# Patient Record
Sex: Female | Born: 1975 | Race: White | Hispanic: No | Marital: Married | Smoking: Never smoker
Health system: Southern US, Community
[De-identification: ages and names within clinical notes are randomized; demographics above are authoritative.]

## PROBLEM LIST (undated history)

## (undated) DIAGNOSIS — I2510 Atherosclerotic heart disease of native coronary artery without angina pectoris: Secondary | ICD-10-CM

## (undated) DIAGNOSIS — IMO0002 Reserved for concepts with insufficient information to code with codable children: Secondary | ICD-10-CM

## (undated) DIAGNOSIS — R87619 Unspecified abnormal cytological findings in specimens from cervix uteri: Secondary | ICD-10-CM

## (undated) DIAGNOSIS — E785 Hyperlipidemia, unspecified: Secondary | ICD-10-CM

## (undated) DIAGNOSIS — N390 Urinary tract infection, site not specified: Secondary | ICD-10-CM

## (undated) DIAGNOSIS — E079 Disorder of thyroid, unspecified: Secondary | ICD-10-CM

## (undated) DIAGNOSIS — I10 Essential (primary) hypertension: Secondary | ICD-10-CM

## (undated) HISTORY — DX: Disorder of thyroid, unspecified: E07.9

## (undated) HISTORY — DX: Unspecified abnormal cytological findings in specimens from cervix uteri: R87.619

## (undated) HISTORY — PX: VASECTOMY: SHX75

## (undated) HISTORY — DX: Reserved for concepts with insufficient information to code with codable children: IMO0002

## (undated) HISTORY — DX: Hyperlipidemia, unspecified: E78.5

## (undated) HISTORY — DX: Urinary tract infection, site not specified: N39.0

## (undated) HISTORY — DX: Atherosclerotic heart disease of native coronary artery without angina pectoris: I25.10

## (undated) HISTORY — DX: Essential (primary) hypertension: I10

---

## 1998-02-27 ENCOUNTER — Ambulatory Visit (HOSPITAL_COMMUNITY): Admission: RE | Admit: 1998-02-27 | Discharge: 1998-02-27 | Payer: Self-pay | Admitting: Obstetrics and Gynecology

## 1999-09-12 ENCOUNTER — Other Ambulatory Visit: Admission: RE | Admit: 1999-09-12 | Discharge: 1999-09-12 | Payer: Self-pay | Admitting: *Deleted

## 2000-03-14 ENCOUNTER — Inpatient Hospital Stay (HOSPITAL_COMMUNITY): Admission: AD | Admit: 2000-03-14 | Discharge: 2000-03-14 | Payer: Self-pay | Admitting: Obstetrics and Gynecology

## 2000-04-16 ENCOUNTER — Inpatient Hospital Stay (HOSPITAL_COMMUNITY): Admission: AD | Admit: 2000-04-16 | Discharge: 2000-04-19 | Payer: Self-pay | Admitting: *Deleted

## 2000-05-31 ENCOUNTER — Other Ambulatory Visit: Admission: RE | Admit: 2000-05-31 | Discharge: 2000-05-31 | Payer: Self-pay | Admitting: *Deleted

## 2000-07-22 ENCOUNTER — Other Ambulatory Visit: Admission: RE | Admit: 2000-07-22 | Discharge: 2000-07-22 | Payer: Self-pay | Admitting: *Deleted

## 2000-11-30 ENCOUNTER — Other Ambulatory Visit: Admission: RE | Admit: 2000-11-30 | Discharge: 2000-11-30 | Payer: Self-pay | Admitting: *Deleted

## 2001-05-02 ENCOUNTER — Emergency Department (HOSPITAL_COMMUNITY): Admission: EM | Admit: 2001-05-02 | Discharge: 2001-05-02 | Payer: Self-pay | Admitting: Emergency Medicine

## 2001-06-14 ENCOUNTER — Other Ambulatory Visit: Admission: RE | Admit: 2001-06-14 | Discharge: 2001-06-14 | Payer: Self-pay | Admitting: *Deleted

## 2002-07-04 ENCOUNTER — Other Ambulatory Visit: Admission: RE | Admit: 2002-07-04 | Discharge: 2002-07-04 | Payer: Self-pay | Admitting: *Deleted

## 2002-10-15 ENCOUNTER — Encounter: Admission: RE | Admit: 2002-10-15 | Discharge: 2002-10-15 | Payer: Self-pay | Admitting: Family Medicine

## 2002-10-15 ENCOUNTER — Encounter: Payer: Self-pay | Admitting: Family Medicine

## 2002-12-31 ENCOUNTER — Emergency Department (HOSPITAL_COMMUNITY): Admission: EM | Admit: 2002-12-31 | Discharge: 2002-12-31 | Payer: Self-pay | Admitting: Emergency Medicine

## 2003-09-12 ENCOUNTER — Other Ambulatory Visit: Admission: RE | Admit: 2003-09-12 | Discharge: 2003-09-12 | Payer: Self-pay | Admitting: *Deleted

## 2005-01-29 ENCOUNTER — Other Ambulatory Visit: Admission: RE | Admit: 2005-01-29 | Discharge: 2005-01-29 | Payer: Self-pay | Admitting: Obstetrics and Gynecology

## 2005-05-06 ENCOUNTER — Inpatient Hospital Stay (HOSPITAL_COMMUNITY): Admission: EM | Admit: 2005-05-06 | Discharge: 2005-05-07 | Payer: Self-pay | Admitting: Emergency Medicine

## 2006-01-19 ENCOUNTER — Ambulatory Visit: Payer: Self-pay | Admitting: Internal Medicine

## 2006-02-11 ENCOUNTER — Other Ambulatory Visit: Admission: RE | Admit: 2006-02-11 | Discharge: 2006-02-11 | Payer: Self-pay | Admitting: Obstetrics and Gynecology

## 2013-06-09 ENCOUNTER — Encounter: Payer: Self-pay | Admitting: Internal Medicine

## 2013-08-23 ENCOUNTER — Encounter: Payer: Self-pay | Admitting: Cardiology

## 2013-08-24 ENCOUNTER — Ambulatory Visit (INDEPENDENT_AMBULATORY_CARE_PROVIDER_SITE_OTHER): Payer: PRIVATE HEALTH INSURANCE | Admitting: Internal Medicine

## 2013-08-24 ENCOUNTER — Encounter: Payer: Self-pay | Admitting: Internal Medicine

## 2013-08-24 VITALS — BP 132/88 | HR 77 | Ht 65.0 in | Wt 158.0 lb

## 2013-08-24 DIAGNOSIS — I10 Essential (primary) hypertension: Secondary | ICD-10-CM

## 2013-08-24 LAB — CBC WITH DIFFERENTIAL/PLATELET
Basophils Absolute: 0.1 10*3/uL (ref 0.0–0.1)
Basophils Relative: 0.8 % (ref 0.0–3.0)
Eosinophils Relative: 1.3 % (ref 0.0–5.0)
Hemoglobin: 13.3 g/dL (ref 12.0–15.0)
Lymphocytes Relative: 16.8 % (ref 12.0–46.0)
Monocytes Relative: 14.4 % — ABNORMAL HIGH (ref 3.0–12.0)
Neutro Abs: 4.3 10*3/uL (ref 1.4–7.7)
RBC: 4.27 Mil/uL (ref 3.87–5.11)
WBC: 6.5 10*3/uL (ref 4.5–10.5)

## 2013-08-24 MED ORDER — AMLODIPINE BESYLATE 2.5 MG PO TABS: 2.5000 mg | ORAL_TABLET | Freq: Every day | ORAL | Status: DC

## 2013-08-24 NOTE — Patient Instructions (Addendum)
You will need lab work today: CBC We will call you with your results  Your physician has recommended you make the following change in your medication:  START: Amlodipine 2.5mg  daily  Your physician recommends that you schedule a follow-up appointment in: 6 weeks with Dr. Tenny Craw

## 2013-08-24 NOTE — Progress Notes (Signed)
HPI   Patient is a 37 yo who was referred by Dr Rana Snare for cardiac assessment Labs in July toal cholesterol 277, HDL 89, LDL 153  Trig 175  BP in July visit was 143/98  TSH normal. Father had AAA  Repaire at 21  CABG in 46s  He was smoker Paternal  Side with CAD  Patient smokes off/on in past.  Not now Runs 20 to 25 miles   No prolblems  Breakfast:  [place] yogurt, [place] sausage, eggs Lunch:  Salad  Crkpsy/grilled chicken Dinner:  Meat, veg,  Not a lot of cheese. Allergies  Allergen Reactions  . Lobster [Shellfish Allergy]     CAUSES PATIENT TO BREAK UP  . Other     SAFFRON SPICES: CAUSE HER THROAT TO CLOSE UP    Current Outpatient Prescriptions  Medication Sig Dispense Refill  . levothyroxine (SYNTHROID, LEVOTHROID) 75 MCG tablet Take 75 mcg by mouth daily before breakfast.       No current facility-administered medications for this visit.    Past Medical History  Diagnosis Date  . Abnormal Pap smear   . Thyroid disease   . Urinary tract infection     Past Surgical History  Procedure Laterality Date  . Vasectomy      Family History  Problem Relation Age of Onset  . Heart disease Other   . Thyroid disease Other   . Gallbladder disease Other   . Arthritis Other   . Hypertension Other   . Cancer Other     Ovarian    History   Social History  . Marital Status: Married    Spouse Name: N/A    Number of Children: N/A  . Years of Education: N/A   Occupational History  . Not on file.   Social History Main Topics  . Smoking status: Never Smoker   . Smokeless tobacco: Not on file  . Alcohol Use: Yes     Comment: 2/3 drinks per day.  . Drug Use: No  . Sexual Activity: Yes   Other Topics Concern  . Not on file   Social History Narrative  . No narrative on file    Review of Systems:  All systems reviewed.  They are negative to the above problem except as previously stated.  Vital Signs: BP 132/88  Pulse 77  Ht 5\' 5"  (1.651 m)  Wt 158 lb (71.668 kg)   BMI 26.29 kg/m2  SpO2 99%  BP on my check:  136/96 bilateral arms  Physical Exam Patient is in NAD HEENT:  Normocephalic, atraumatic. EOMI, PERRLA.  Neck: JVP is normal.  No bruits.  Lungs: clear to auscultation. No rales no wheezes.  Heart: Regular rate and rhythm. Normal S1, S2. No S3.   No significant murmurs. PMI not displaced.  Abdomen:  Supple, nontender. Normal bowel sounds. No masses. No hepatomegaly.  Extremities:   Good distal pulses throughout. No lower extremity edema.  Musculoskeletal :moving all extremities.  Neuro:   alert and oriented x3.  CN II-XII grossly intact.  EKG  SR 77.  ST T wave changes consistent with early repol. Assessment and Plan:  1.  HTN  BP is high today  Now has been up on several occasions.  Would Rx with Norvasc 2.5 Keep BP at home  Bring in cuff.  F/U in clinic in 6 wks  Watch salt  2.  Dyslipidemia  Patiient's LDL is high at 153  Diet does not seem to explain.  HDL is excellent  at 69 I would recomm getting lipomed panel in 6 month from last test.  Watch diet  Keep active COnsider vasc screen  3.  Hx ? SVT  Patient seen in past for 2 spells of ?SVT   Signif  None since  Will pull paper chart.

## 2013-10-16 ENCOUNTER — Encounter: Payer: Self-pay | Admitting: Internal Medicine

## 2013-10-16 ENCOUNTER — Ambulatory Visit (INDEPENDENT_AMBULATORY_CARE_PROVIDER_SITE_OTHER): Payer: PRIVATE HEALTH INSURANCE | Admitting: Internal Medicine

## 2013-10-16 VITALS — BP 126/86 | HR 90 | Ht 65.0 in | Wt 160.0 lb

## 2013-10-16 DIAGNOSIS — E785 Hyperlipidemia, unspecified: Secondary | ICD-10-CM

## 2013-10-16 NOTE — Progress Notes (Signed)
HPI   Patient is a 37 yo who I saw in September for cardiac risk assessment.   Labs in July toal cholesterol 277, HDL 89, LDL 153  Trig 175  BP in July visit was 143/98  TSH normal. Father had AAA  Repaire at 31  CABG in 33s  He was smoker Paternal  Side with CAD  Patient smokes off/on in past.  Not now Runs 20 to 25 miles   No prolblems  Breakfast:  [place] yogurt, [place] sausage, eggs Lunch:  Salad  Crkpsy/grilled chicken Dinner:  Meat, veg,  Not a lot of cheese.  When I saw her I added norvasc to her regimen  She has done well  Deneis dizziness.  BP 120s/70s  No edema  No SOB.   Allergies  Allergen Reactions  . Lobster [Shellfish Allergy]     (LOBSTER AND CRAB )  CAUSES PATIENT TO BREAK UP  . Other     SAFFRON SPICES: CAUSE HER THROAT TO CLOSE UP    Current Outpatient Prescriptions  Medication Sig Dispense Refill  . amLODipine (NORVASC) 2.5 MG tablet Take 1 tablet (2.5 mg total) by mouth daily.  180 tablet  3  . levothyroxine (SYNTHROID, LEVOTHROID) 75 MCG tablet Take 75 mcg by mouth daily before breakfast.       No current facility-administered medications for this visit.    Past Medical History  Diagnosis Date  . Abnormal Pap smear   . Thyroid disease   . Urinary tract infection     Past Surgical History  Procedure Laterality Date  . Vasectomy      Family History  Problem Relation Age of Onset  . Heart disease Other   . Thyroid disease Other   . Gallbladder disease Other   . Arthritis Other   . Hypertension Other   . Cancer Other     Ovarian    History   Social History  . Marital Status: Married    Spouse Name: N/A    Number of Children: N/A  . Years of Education: N/A   Occupational History  . Not on file.   Social History Main Topics  . Smoking status: Never Smoker   . Smokeless tobacco: Not on file  . Alcohol Use: Yes     Comment: 2/3 drinks per day.  . Drug Use: No  . Sexual Activity: Yes   Other Topics Concern  . Not on file   Social  History Narrative  . No narrative on file    Review of Systems:  All systems reviewed.  They are negative to the above problem except as previously stated.  Vital Signs: BP 126/86  Pulse 90  Ht 5\' 5"  (1.651 m)  Wt 160 lb (72.576 kg)  BMI 26.63 kg/m2  SpO2 91%  BP on my check:  136/96 bilateral arms  Physical Exam Patient is in NAD HEENT:  Normocephalic, atraumatic. EOMI, PERRLA.  Neck: JVP is normal.  No bruits.  Lungs: clear to auscultation. No rales no wheezes.  Heart: Regular rate and rhythm. Normal S1, S2. No S3.   No significant murmurs. PMI not displaced.  Abdomen:  Supple, nontender. Normal bowel sounds. No masses. No hepatomegaly.  Extremities:   Good distal pulses throughout. No lower extremity edema.  Musculoskeletal :moving all extremities.  Neuro:   alert and oriented x3.  CN II-XII grossly intact.  Assessment and Plan:  1.  HTN  BP is better  Would keep on same regimen  F/U in 1  year    2.  Dyslipidemia  Patiient's LDL is high at 153  Diet does not seem to explain.  HDL is excellent at 89 I would recomm getting lipomed panel in a few months.   COnsider vasc screen

## 2013-10-16 NOTE — Patient Instructions (Signed)
Your physician wants you to follow-up in: ONE YEAR WITH DR Filbert Schilder will receive a reminder letter in the mail two months in advance. If you don't receive a letter, please call our office to schedule the follow-up appointment.   Your physician recommends that you return for lab work in= MARCH- DO NOT EAT PRIOR TO LAB WORK

## 2014-07-17 ENCOUNTER — Encounter: Payer: Self-pay | Admitting: Internal Medicine

## 2014-08-29 ENCOUNTER — Other Ambulatory Visit: Payer: Self-pay | Admitting: *Deleted

## 2014-08-29 DIAGNOSIS — I10 Essential (primary) hypertension: Secondary | ICD-10-CM

## 2014-08-29 MED ORDER — AMLODIPINE BESYLATE 2.5 MG PO TABS: 2.5000 mg | ORAL_TABLET | Freq: Every day | ORAL | Status: DC

## 2015-02-02 ENCOUNTER — Other Ambulatory Visit: Payer: Self-pay | Admitting: Internal Medicine

## 2015-03-12 ENCOUNTER — Encounter: Payer: Self-pay | Admitting: Sports Medicine

## 2015-03-12 ENCOUNTER — Ambulatory Visit (INDEPENDENT_AMBULATORY_CARE_PROVIDER_SITE_OTHER): Payer: PRIVATE HEALTH INSURANCE | Admitting: Sports Medicine

## 2015-03-12 DIAGNOSIS — M84353A Stress fracture, unspecified femur, initial encounter for fracture: Secondary | ICD-10-CM | POA: Insufficient documentation

## 2015-03-12 DIAGNOSIS — E039 Hypothyroidism, unspecified: Secondary | ICD-10-CM | POA: Insufficient documentation

## 2015-03-12 DIAGNOSIS — M84359A Stress fracture, hip, unspecified, initial encounter for fracture: Secondary | ICD-10-CM | POA: Diagnosis not present

## 2015-03-12 NOTE — Progress Notes (Signed)
Jodi AlamoJulia Reynolds - 39 y.o. female MRN [id number]  Date of birth: 07-Oct-1976  SUBJECTIVE:  Including CC & ROS.  Patient is a 39 yo C female present for gait analysis and recommendations for running following a recent femoral neck compression side stress fracture.  Patient reports she has been doing regular training for marathons and 1/2 marathons for the past 2 years She was doing about 20 mile training day the week of January 2nd when she stared having right hip pain.  The following week she cut back her volume but continued to have pain She was [facility] and was sent for MRI which relieved a compression side stress fracture grade 1-2.  Patient was started on NWB crutches for the next 3 weeks.  Her pain resolved and she was able go on a trip to [place] and walked a lot with no pain.  She is eager to return to running but does not know how aggressive to start She has not done any running for the past 2 months.   ROS: Review of systems otherwise negative except for information present in HPI  HISTORY: Past Medical, Surgical, Social, and Family History Reviewed & Updated per EMR. Pertinent Historical Findings include: HTN Hypothyroidism She reports a normal diet with protein and calcium Normal menstrual cycles with monthly TSH has been well controlled  DATA REVIEWED: Personally review patient's MRI from January with evidence of grade 2 compression side stress fracture  PHYSICAL EXAM:  VS: BP:(!) 144/86 mmHg  HR: bpm  TEMP: ( )  RESP:   HT:5\' 5"  (165.1 cm)   WT:155 lb (70.308 kg)  BMI:25.8 HIP EXAM:  General: well nourished Skin of LE: warm; dry, no rashes, lesions, ecchymosis or erythema. Vascular: Dorsal pedal and femoral pulses 2+ bilaterally Neurologically: Sensation to light touch lower extremities equal and intact bilaterally.    Observation - no ecchymosis, edema, or hematoma present over the anterior, lateral, or posterior soft  tissue surrounding the hip Palpation:  No anterior hip joint tenderness No tenderness over the pubic synthesis,  No tenderness of the the ASIS, no pain over the iliac crest,  No tenderness of the lateral greater trochanter or bursa, No PSIS tenderness or SI joint pain ROM: Normal Hip motion in flexion, extension, internal and external rotation Muscle strength: No pain or weakness with iliopsoas flexion, rectus femoral flexion, hamstring and gluteal extension, hip adduction or abduction.  No pain or weakness with gluteus medius and minimus hip abduction, abdominal muscle contraction forward or oblique positions. Strength for muscle testing in all plane was 5/5.  FEET/ GAIT EXAM:  Normal ankle motion and strength bilaterally  Extension/flexion 5/5 strength bilaterally in toes Weight-bearing foot exam:  First ray and second ray splaying Longitudinal arch: pes plantus Heel position: neutral Leg Length discrepancy: none Gait analysis:  Striking location: midfoot with no cross over of feet when running Foot motion: pronation Knee motion: more internal rotation Hip motion: pelvic translation with poor internal rotation  ASSESSMENT & PLAN: See problem based charting & AVS for pt instructions. Impression: Femoral Neck compression stress fracture Gait abnormalities Pronation with pes plantus  Recommendations: -Start interval running this week to assess level of pain.  -Start with 4 total 20 minute runs doing 2 minutes running and 1 minute walking -Follow up in 1 week after this initial trial for custom orthotics -At follow up we will start a gradual stress fracture return to running protocol stressing the important  of improving internal rotation of the hip  We spent greater than 50% of our 30 minute office visit in counseling education regarding the patient current clinical problem, risks and benefits of treatment options, and recommend plans for treatment or further evaluation

## 2015-03-12 NOTE — Patient Instructions (Signed)
Over next week:   4 runs total 20 minutes with interval of 2 minute running and 1 minute walking  F/u next Thursday for orthotics

## 2015-03-21 ENCOUNTER — Ambulatory Visit (INDEPENDENT_AMBULATORY_CARE_PROVIDER_SITE_OTHER): Payer: PRIVATE HEALTH INSURANCE | Admitting: Sports Medicine

## 2015-03-21 ENCOUNTER — Other Ambulatory Visit: Payer: Self-pay | Admitting: Obstetrics and Gynecology

## 2015-03-21 ENCOUNTER — Encounter: Payer: Self-pay | Admitting: Sports Medicine

## 2015-03-21 VITALS — BP 130/70 | Ht 65.0 in | Wt 155.0 lb

## 2015-03-21 DIAGNOSIS — M84359D Stress fracture, hip, unspecified, subsequent encounter for fracture with routine healing: Secondary | ICD-10-CM

## 2015-03-21 DIAGNOSIS — M84353D Stress fracture, unspecified femur, subsequent encounter for fracture with routine healing: Secondary | ICD-10-CM

## 2015-03-21 DIAGNOSIS — R269 Unspecified abnormalities of gait and mobility: Secondary | ICD-10-CM

## 2015-03-21 DIAGNOSIS — R928 Other abnormal and inconclusive findings on diagnostic imaging of breast: Secondary | ICD-10-CM

## 2015-03-21 NOTE — Progress Notes (Signed)
Jodi Reynolds - 39 y.o. female MRN [id number]  Date of birth: 01/30/1976  SUBJECTIVE:  Including CC & ROS.  Patient is a 39 yo C female present for gait analysis and recommendations for running following a recent femoral neck compression side stress fracture. -Patient was last seen approximately 2 weeks ago. Gait analysis and evaluation revealed that the patient had likely been nonweightbearing for a reasonable amount of time of approximately 3 weeks. -Patient has not done any running for over 6 weeks. Was -At her last visit patient was advised to start a gradual interval running no more than 4 times in the next week running only 20 minutes total with a 2-minute run in 1 minute walk  -Patient reports she did not develop any hip pain or have any difficulty returning to running this past week -She presents today for making custom orthotics to help with gait abnormalities and also discuss a gradual return to running program.    ROS: Review of systems otherwise negative except for information present in HPI  HISTORY: Past Medical, Surgical, Social, and Family History Reviewed & Updated per EMR. Pertinent Historical Findings include: HTN Hypothyroidism She reports a normal diet with protein and calcium Normal menstrual cycles with monthly TSH has been well controlled  DATA REVIEWED: Personally review patient's MRI from January with evidence of grade 2 compression side stress fracture  PHYSICAL EXAM:  VS: BP:130/70 mmHg  HR: bpm  TEMP: ( )  RESP:   HT:5\' 5"  (165.1 cm)   WT:155 lb (70.308 kg)  BMI:25.8 FEET/ GAIT EXAM:  General: well nourished Skin of LE: warm; dry, no rashes, lesions, ecchymosis or erythema. Vascular: Dorsal pedal pulses 2+ bilaterally Neurologically: Sensation to light touch lower extremities equal and intact bilaterally.  Normal ankle motion and strength bilaterally  Extension/flexion 5/5 strength bilaterally in toes Weight-bearing foot exam:  First ray and second  ray splaying Longitudinal arch: pes plantus Heel position: neutral Leg Length discrepancy: none Gait analysis:  Striking location: midfoot with no cross over of feet when running Foot motion: pronation Knee motion: more internal rotation Hip motion: pelvic translation with poor internal rotation  ASSESSMENT & PLAN: See problem based charting & AVS for pt instructions. Recommendations: -Advised patient that she can resume running charting with a gradual interval increase in intensity and volume. Patient will be provided with a return to play protocol outline from up-to-date consisting of a gradual interval resumption of running activity. -Advised patient to work on running gait with lying training to improve a crossover of her feet with running. -Patient has significant be appropriate to run a half marathon in May. Advised her that she take approximately another 4-6 weeks gradual increase in running before she will be to her previous level. As long she has no intentions of running and aggressive half marathon I will not advise her to not do this activity. -Patient was agreeable this plan she will follow-up as needed with ourselves or Dr. Farris HasKramer  Orthotic Fitting and Adjustment note: Patient was fitted for a : standard, cushioned, semi-rigid orthotic.  The orthotic was heated and afterward the patient stood on the orthotic blank positioned on the orthotic stand.  The patient was positioned in subtalar neutral position and 10 degrees of ankle dorsiflexion in a weight bearing stance.  After completion of molding, a stable base was applied to the orthotic blank.  The blank was ground to a stable position for weight bearing.  Size: 9 Base: Blue EVA Posting: none Additional orthotic padding: medial  wedge  Greater than 50% of the patient's visit for a total of 30 minutes, was spent conducting face-to-face counseling for bilateral foot orthotics fitting and constructing

## 2015-03-29 ENCOUNTER — Ambulatory Visit
Admission: RE | Admit: 2015-03-29 | Discharge: 2015-03-29 | Disposition: A | Discharge status: Home / Self Care | Payer: PRIVATE HEALTH INSURANCE | Source: Ambulatory Visit | Attending: Obstetrics and Gynecology | Admitting: Obstetrics and Gynecology

## 2015-03-29 DIAGNOSIS — R928 Other abnormal and inconclusive findings on diagnostic imaging of breast: Secondary | ICD-10-CM

## 2015-04-03 ENCOUNTER — Other Ambulatory Visit: Payer: Self-pay | Admitting: Adult Health

## 2015-08-29 ENCOUNTER — Other Ambulatory Visit: Payer: Self-pay | Admitting: Internal Medicine

## 2015-10-21 ENCOUNTER — Encounter: Payer: Self-pay | Admitting: Internal Medicine

## 2015-10-21 ENCOUNTER — Ambulatory Visit (INDEPENDENT_AMBULATORY_CARE_PROVIDER_SITE_OTHER): Payer: PRIVATE HEALTH INSURANCE | Admitting: Internal Medicine

## 2015-10-21 VITALS — BP 144/82 | HR 92 | Ht 65.0 in | Wt 172.4 lb

## 2015-10-21 DIAGNOSIS — E785 Hyperlipidemia, unspecified: Secondary | ICD-10-CM | POA: Diagnosis not present

## 2015-10-21 MED ORDER — AMLODIPINE BESYLATE 2.5 MG PO TABS: 2.5000 mg | ORAL_TABLET | Freq: Every day | ORAL | Status: DC

## 2015-10-21 NOTE — Patient Instructions (Signed)
Medication Instructions:  Your physician recommends that you continue on your current medications as directed. Please refer to the Current Medication list given to you today.   Labwork: Your physician recommends that you return for lab work in: Cardio IQ- on Thursday   Testing/Procedures: Your physician has requested that you have Calcium Scoring.  This is done in our [address] office. This is not run through insurance and is a one time cost of $150.     Follow-Up: Your physician wants you to follow-up in: 12 months with Dr. Tenny Crawoss.  You will receive a reminder letter in the mail two months in advance. If you don't receive a letter, please call our office to schedule the follow-up appointment.   Any Other Special Instructions Will Be Listed Below (If Applicable).     If you need a refill on your cardiac medications before your next appointment, please call your pharmacy.

## 2015-10-21 NOTE — Progress Notes (Signed)
   Cardiology Office Note   Date:  10/21/2015   ID:  Jodi Reynolds, DOB 05-10-1976, MRN [id number]  PCP:  No PCP Per Patient  Cardiologist:   Dietrich PatesPaula Osmin Welz, MD   No chief complaint on file.     History of Present Illness: Jodi Reynolds is a 39 y.o. female with a history of HL  And HTN   I saw her in 2014.  LDL 153.  FHx of CAD  Pt with history of tobacco Since I saw her she has restarted running  She just finished a 1/2 marathon.  Earlier this year she had a fx of femur head   Denies Cp  NO SOB  No dizziness   Diet about the same  Not perfect   Current Outpatient Prescriptions  Medication Sig Dispense Refill  . amLODipine (NORVASC) 2.5 MG tablet Take 2.5 mg by mouth daily.    . fluticasone (CUTIVATE) 0.05 % cream Apply 1 application topically daily as needed (DRY SKIN).     Marland Kitchen. levothyroxine (SYNTHROID, LEVOTHROID) 75 MCG tablet Take 75 mcg by mouth daily before breakfast.     No current facility-administered medications for this visit.    Allergies:   Lobster and Other   Past Medical History  Diagnosis Date  . Abnormal Pap smear   . Thyroid disease   . Urinary tract infection     Past Surgical History  Procedure Laterality Date  . Vasectomy       Social History:  The patient  reports that she has never smoked. She has never used smokeless tobacco. She reports that she drinks alcohol. She reports that she does not use illicit drugs.   Family History:  The patient's family history includes Arthritis in her other; Cancer in her other; Gallbladder disease in her other; Healthy in her sister; Heart disease in her mother and other; Hypertension in her mother and other; Thyroid disease in her other.    ROS:  Please see the history of present illness. All other systems are reviewed and  Negative to the above problem except as noted.    PHYSICAL EXAM: VS:  BP 144/82 mmHg  Pulse 92  Ht 5\' 5"  (1.651 m)  Wt 172 lb 6.4 oz (78.2 kg)  BMI 28.69 kg/m2  GEN: Well nourished, well  developed, in no acute distress HEENT: normal Neck: no JVD, carotid bruits, or masses Cardiac: RRR; no murmurs, rubs, or gallops,no edema  Respiratory:  clear to auscultation bilaterally, normal work of breathing GI: soft, nontender, nondistended, + BS  No hepatomegaly  MS: no deformity Moving all extremities   Skin: warm and dry, no rash Neuro:  Strength and sensation are intact Psych: euthymic mood, full affect   EKG:  EKG is ordered today.  SR 92 bpm     Lipid Panel No results found for: CHOL, TRIG, HDL, CHOLHDL, VLDL, LDLCALC, LDLDIRECT    Wt Readings from Last 3 Encounters:  10/21/15 172 lb 6.4 oz (78.2 kg)  03/21/15 155 lb (70.308 kg)  03/12/15 155 lb (70.308 kg)      ASSESSMENT AND PLAN:  1  HL  Will set up for fasting lipids  (lipomed0 Would also set up for cardiac calcium score to determine how aggressive to be with lipids      Signed, Dietrich PatesPaula Izic Stfort, MD  10/21/2015 2:00 PM    [facility] [address], [place], [place]  [address] Phone: [phone]; Fax: [phone]

## 2015-10-24 ENCOUNTER — Other Ambulatory Visit (INDEPENDENT_AMBULATORY_CARE_PROVIDER_SITE_OTHER): Payer: PRIVATE HEALTH INSURANCE | Admitting: *Deleted

## 2015-10-24 ENCOUNTER — Ambulatory Visit (INDEPENDENT_AMBULATORY_CARE_PROVIDER_SITE_OTHER)
Admission: RE | Admit: 2015-10-24 | Discharge: 2015-10-24 | Disposition: A | Discharge status: Home / Self Care | Payer: PRIVATE HEALTH INSURANCE | Source: Ambulatory Visit | Attending: Internal Medicine | Admitting: Internal Medicine

## 2015-10-24 DIAGNOSIS — E785 Hyperlipidemia, unspecified: Secondary | ICD-10-CM | POA: Diagnosis not present

## 2015-10-24 NOTE — Addendum Note (Signed)
Addended by: Tonita PhoenixBOWDEN, Marranda Arakelian K on: 10/24/2015 09:13 AM   Modules accepted: Orders

## 2015-10-24 NOTE — Addendum Note (Signed)
Addended by: Tonita PhoenixBOWDEN, Anysha Frappier K on: 10/24/2015 09:12 AM   Modules accepted: Orders

## 2015-10-28 LAB — CARDIO IQ(R) ADVANCED LIPID PANEL
Apolipoprotein B: 90 mg/dL (ref 49–103)
CHOLESTEROL, TOTAL (CARDIO IQ ADV LIPID PANEL): 242 mg/dL — AB (ref 125–200)
Cholesterol/HDL Ratio: 2.8 calc (ref <=5.0)
HDL CHOLESTEROL (CARDIO IQ ADV LIPID PANEL): 88 mg/dL (ref >=46)
LDL CHOLESTEROL CALCULATED (CARDIO IQ ADV LIPID PANEL): 118 mg/dL
LDL LARGE: 12117 nmol/L (ref 5038–17886)
LDL Medium: 287 nmol/L (ref 121–397)
LDL PARTICLE NUMBER: 1472 nmol/L (ref 1016–2185)
LDL PEAK SIZE: 221.3 Angstrom (ref >=218.2)
LDL Small: 174 nmol/L (ref 115–386)
Lipoprotein (a): <10 nmol/L (ref <75)
Non-HDL Cholesterol: 154 mg/dL
Triglycerides: 181 mg/dL — ABNORMAL HIGH

## 2015-11-04 ENCOUNTER — Telehealth: Payer: Self-pay | Admitting: *Deleted

## 2015-11-04 DIAGNOSIS — E785 Hyperlipidemia, unspecified: Secondary | ICD-10-CM

## 2015-11-04 MED ORDER — ATORVASTATIN CALCIUM 20 MG PO TABS: 20.0000 mg | ORAL_TABLET | Freq: Every day | ORAL | Status: DC

## 2015-11-04 MED ORDER — ASPIRIN EC 81 MG PO TBEC: 81.0000 mg | DELAYED_RELEASE_TABLET | Freq: Every day | ORAL | Status: DC

## 2015-11-04 NOTE — Telephone Encounter (Signed)
-----   Message from Pricilla RifflePaula Ross V, MD sent at 11/04/2015  5:10 PM EST ----- Called pt   Discussed CT results Needs Lipitor 20 mg    EcASA 81 mg Call in to [facility] [place] F/U lipomed in 2 months with AST

## 2015-11-04 NOTE — Telephone Encounter (Signed)
Medications and lab work ordered.

## 2015-12-18 ENCOUNTER — Telehealth: Payer: Self-pay | Admitting: Internal Medicine

## 2015-12-18 NOTE — Telephone Encounter (Signed)
New Message  Pt called req a call back to discuss medication list in detail. Please call

## 2015-12-18 NOTE — Telephone Encounter (Signed)
Spoke with pt. She would like to schedule an office visit with Dr. Tenny Crawoss as she has several questions she would like to ask her.  First available appt is 02/10/16 and pt has been scheduled for this day.  She is requesting sooner appt.  Will be out of town 1/17-2/3.  Will forward to Michalene/Dr. Tenny Crawoss to see if sooner appt can be found.

## 2015-12-25 NOTE — Telephone Encounter (Signed)
Add her on to Jan 30 at 9:45

## 2015-12-25 NOTE — Telephone Encounter (Signed)
Called and confirmed with patient that she will be out of town on 01/20/16.  See note below 12/18/15. Advised patient I will call her if cancellation arises that will work into her schedule. She had no questions that I could answer at this time and will discuss with Dr. Tenny Crawoss during her visit in Feb.

## 2016-02-09 NOTE — Progress Notes (Signed)
Cardiology Office Note   Date:  02/10/2016   ID:  Jodi Reynolds, DOB 04-19-1976, MRN [id number]  PCP:  [facility]  Cardiologist:   Dietrich Pates, MD   No chief complaint on file.  F/U of CT scan and HTN     History of Present Illness: Jodi Reynolds is a 40 y.o. female with a history of HTN and HL  I saw her in October  She started working out at that time  CA score of 61  I recomm lipitor and ASA   Since seen she has done well  Traveled to [place]  BReathing is OK  Runs a few times per week.      Current Outpatient Prescriptions  Medication Sig Dispense Refill  . fluticasone (CUTIVATE) 0.05 % cream Apply 1 application topically daily as needed (DRY SKIN).     Marland Kitchen levothyroxine (SYNTHROID, LEVOTHROID) 75 MCG tablet Take 75 mcg by mouth daily before breakfast.    . amLODipine (NORVASC) 5 MG tablet Take 1 tablet (5 mg total) by mouth daily. 90 tablet 3  . aspirin EC 81 MG tablet Take 1 tablet (81 mg total) by mouth daily. 90 tablet 3  . atorvastatin (LIPITOR) 10 MG tablet Take 0.5 tablets (5 mg total) by mouth daily. 30 tablet 11   No current facility-administered medications for this visit.    Allergies:   Lobster and Other   Past Medical History  Diagnosis Date  . Abnormal Pap smear   . Thyroid disease   . Urinary tract infection     Past Surgical History  Procedure Laterality Date  . Vasectomy       Social History:  The patient  reports that she has never smoked. She has never used smokeless tobacco. She reports that she drinks alcohol. She reports that she does not use illicit drugs.   Family History:  The patient's family history includes Arthritis in her other; Cancer in her other; Gallbladder disease in her other; Healthy in her sister; Heart disease in her mother and other; Hypertension in her mother and other; Thyroid disease in her other.  Father with CAD (CABG)  ROS:  Please see the history of present illness. All  other systems are reviewed and  Negative to the above problem except as noted.    PHYSICAL EXAM: VS:  BP 140/98 mmHg  Pulse 88  Wt 77.474 kg (170 lb 12.8 oz)  ON my check 130/95 GEN: Well nourished, well developed, in no acute distress HEENT: normal Neck: no JVD, carotid bruits, or masses Cardiac: RRR; no murmurs, rubs, or gallops,no edema  Respiratory:  clear to auscultation bilaterally, normal work of breathing GI: soft, nontender, nondistended, + BS  No hepatomegaly  MS: no deformity Moving all extremities   Skin: warm and dry, no rash Neuro:  Strength and sensation are intact Psych: euthymic mood, full affect   EKG:  EKG is not ordered today.   Lipid Panel    Component Value Date/Time   CHOL 242* 10/24/2015 1021   TRIG 181* 10/24/2015 1021   HDL 88 10/24/2015 1021   CHOLHDL 2.8 10/24/2015 1021   LDLCALC 118 10/24/2015 1021      Wt Readings from Last 3 Encounters:  02/10/16 77.474 kg (170 lb 12.8 oz)  10/21/15 78.2 kg (172 lb 6.4 oz)  03/21/15 70.308 kg (155 lb)      ASSESSMENT AND PLAN:  1  HTN  BP is high today  Has not taken meds  Was 134/95 on my check I I would recomm she increase amlodipine to 5 mg   Watch BP at home  Bring in BP cuff to check accuracy  2. CAD  Pt with evid of calcium build up along LAD consistent with plaquing   I would recomm a statin to lower choleresterol and act as antiinflamm Check lipids and AST in 8 wks Rec ASA  Check CBC in 8 wks    3  Lipids As above  Will start lipitor 5  Lipid panel 2 months from starting    4  EtOH  Pt admits to 4 drinks per day  I discussed data (cancer, cardiac risks) against alcohol excess  Recomm at most 1 serving per day on average        Signed, Dietrich Pates, MD  02/10/2016 11:06 AM    [facility] [address] [place], [place], [place]  [address] Phone: [phone]; Fax: [phone]

## 2016-02-10 ENCOUNTER — Encounter: Payer: Self-pay | Admitting: Internal Medicine

## 2016-02-10 ENCOUNTER — Ambulatory Visit (INDEPENDENT_AMBULATORY_CARE_PROVIDER_SITE_OTHER): Payer: PRIVATE HEALTH INSURANCE | Admitting: Internal Medicine

## 2016-02-10 VITALS — BP 140/98 | HR 88 | Wt 170.8 lb

## 2016-02-10 DIAGNOSIS — I10 Essential (primary) hypertension: Secondary | ICD-10-CM | POA: Diagnosis not present

## 2016-02-10 DIAGNOSIS — E785 Hyperlipidemia, unspecified: Secondary | ICD-10-CM | POA: Diagnosis not present

## 2016-02-10 MED ORDER — AMLODIPINE BESYLATE 5 MG PO TABS: 5.0000 mg | ORAL_TABLET | Freq: Every day | ORAL | Status: DC

## 2016-02-10 MED ORDER — ATORVASTATIN CALCIUM 10 MG PO TABS: 5.0000 mg | ORAL_TABLET | Freq: Every day | ORAL | Status: DC

## 2016-02-10 MED ORDER — ASPIRIN EC 81 MG PO TBEC: 81.0000 mg | DELAYED_RELEASE_TABLET | Freq: Every day | ORAL | Status: DC

## 2016-02-10 NOTE — Patient Instructions (Signed)
Your physician has recommended you make the following change in your medication:  1.) start aspirin 81 mg daily 2.) start lipitor 5 mg daily 3.) increase amlodipine to 5 mg daily  Your physician recommends that you schedule a follow-up appointment in about 2 months after you start taking Lipitor.  Please schedule for lab work (lipids, NMR, AST, CBC) and a blood pressure check with the nurse.  Please bring your home cuff with you.

## 2016-05-11 ENCOUNTER — Other Ambulatory Visit: Payer: PRIVATE HEALTH INSURANCE

## 2016-05-21 ENCOUNTER — Other Ambulatory Visit: Payer: Self-pay | Admitting: Obstetrics and Gynecology

## 2016-05-21 DIAGNOSIS — R928 Other abnormal and inconclusive findings on diagnostic imaging of breast: Secondary | ICD-10-CM

## 2016-05-26 ENCOUNTER — Ambulatory Visit
Admission: RE | Admit: 2016-05-26 | Discharge: 2016-05-26 | Disposition: A | Discharge status: Home / Self Care | Payer: PRIVATE HEALTH INSURANCE | Source: Ambulatory Visit | Attending: Obstetrics and Gynecology | Admitting: Obstetrics and Gynecology

## 2016-05-26 DIAGNOSIS — R928 Other abnormal and inconclusive findings on diagnostic imaging of breast: Secondary | ICD-10-CM

## 2017-03-09 ENCOUNTER — Other Ambulatory Visit: Payer: Self-pay | Admitting: Internal Medicine

## 2017-04-04 ENCOUNTER — Other Ambulatory Visit: Payer: Self-pay | Admitting: Internal Medicine

## 2017-04-17 ENCOUNTER — Other Ambulatory Visit: Payer: Self-pay | Admitting: Internal Medicine

## 2017-05-24 ENCOUNTER — Other Ambulatory Visit: Payer: Self-pay | Admitting: Obstetrics and Gynecology

## 2017-05-24 DIAGNOSIS — Z1231 Encounter for screening mammogram for malignant neoplasm of breast: Secondary | ICD-10-CM

## 2017-06-01 ENCOUNTER — Telehealth: Payer: Self-pay | Admitting: Internal Medicine

## 2017-06-01 ENCOUNTER — Other Ambulatory Visit: Payer: Self-pay | Admitting: Family Medicine

## 2017-06-01 DIAGNOSIS — R1011 Right upper quadrant pain: Secondary | ICD-10-CM

## 2017-06-01 NOTE — Telephone Encounter (Signed)
New message   Pt c/o BP issue: STAT if pt c/o blurred vision, one-sided weakness or slurred speech  1. What are your last 5 BP readings? 150/100  2. Are you having any other symptoms (ex. Dizziness, headache, blurred vision, passed out)? no  3. What is your BP issue? Running high - pt made appt with Jodi Reynolds for 6/21 at 8AM but feels that might be too long and requests to speak with nurse.

## 2017-06-01 NOTE — Telephone Encounter (Signed)
Spoke with pt, she reports she has not been really checking her bp but she had a procedure a couple months ago and her bp was elevated. The reading today is prior to any medications. She feels fine. She will check her bp a couple hours after taking medications if okay she will track and bring a record to her follow up appt. If continues to be elevated she will call back. Pt agreed with this plan.

## 2017-06-02 ENCOUNTER — Ambulatory Visit
Admission: RE | Admit: 2017-06-02 | Discharge: 2017-06-02 | Disposition: A | Discharge status: Home / Self Care | Payer: PRIVATE HEALTH INSURANCE | Source: Ambulatory Visit | Attending: Family Medicine | Admitting: Family Medicine

## 2017-06-02 ENCOUNTER — Ambulatory Visit
Admission: RE | Admit: 2017-06-02 | Discharge: 2017-06-02 | Disposition: A | Discharge status: Home / Self Care | Payer: PRIVATE HEALTH INSURANCE | Source: Ambulatory Visit | Attending: Obstetrics and Gynecology | Admitting: Obstetrics and Gynecology

## 2017-06-02 DIAGNOSIS — Z1231 Encounter for screening mammogram for malignant neoplasm of breast: Secondary | ICD-10-CM

## 2017-06-02 DIAGNOSIS — R1011 Right upper quadrant pain: Secondary | ICD-10-CM

## 2017-06-08 ENCOUNTER — Encounter: Payer: Self-pay | Admitting: Physician Assistant

## 2017-06-08 NOTE — Progress Notes (Signed)
Cardiology Office Note    Date:  06/10/2017   ID:  Jodi Reynolds, DOB [birth year], MRN [id number]  [facility]  Cardiologist:  Dr. Tenny Crawoss  Chief Complaint: Yearly  follow-up for hypertension and hyperlipidemia  History of Present Illness:   Jodi Reynolds is a 41 y.o. female with a history of hypertension, hyperlipidemia, coronary calcification on CTA of chest (mid & distal LAD), strong family hx of CAD and thyroid disease present for follow-up.  Last seen by Dr. Tenny Crawoss 01/2016. His blood pressure was elevated. Resumed amlodipine at increased dose of 5mg  qd.   Here today for follow up. She never started Lipitor. She runs approximately 3-4 miles 3 times per week without any issue.The patient denies nausea, vomiting, fever, chest pain, palpitations, shortness of breath, orthopnea, PND, dizziness, syncope, cough, congestion, abdominal pain, hematochezia, melena, lower extremity edema.  Recently dealing with right upper quadrant abdominal pain. Ultrasound of the gallbladder was normal. Has appointment with GI in the upcoming weeks.   Past Medical History:  Diagnosis Date  . Abnormal Pap smear   . Coronary artery calcification seen on CT scan    in 2016  . Hyperlipidemia   . Hypertension   . Thyroid disease   . Urinary tract infection     Past Surgical History:  Procedure Laterality Date  . VASECTOMY      Current Medications: Prior to Admission medications   Medication Sig Start Date End Date Taking? Authorizing Provider  amLODipine (NORVASC) 5 MG tablet Take 1 tablet (5 mg total) by mouth daily. *Patient is overdue for an appointment and needs to call and schedule for further refills* 04/05/17   Pricilla Riffleoss, Paula V, MD  aspirin EC 81 MG tablet Take 1 tablet (81 mg total) by mouth daily. 02/10/16   Pricilla Riffleoss, Paula V, MD  atorvastatin (LIPITOR) 10 MG tablet Take 0.5 tablets (5 mg total) by mouth daily. 02/10/16   Pricilla Riffleoss, Paula V, MD  fluticasone (CUTIVATE) 0.05 %  cream Apply 1 application topically daily as needed (DRY SKIN).  12/20/14   [provider]  levothyroxine (SYNTHROID, LEVOTHROID) 75 MCG tablet Take 75 mcg by mouth daily before breakfast.    [provider]    Allergies:   Josefina DoLobster [shellfish allergy] and Other   Social History   Social History  . Marital status: Married    Spouse name: N/A  . Number of children: N/A  . Years of education: N/A   Social History Main Topics  . Smoking status: Never Smoker  . Smokeless tobacco: Never Used  . Alcohol use 0.0 oz/week     Comment: 2/3 drinks per day.  . Drug use: No  . Sexual activity: Yes   Other Topics Concern  . None   Social History Narrative  . None     Family History:  The patient's family history includes Arthritis in her other; Cancer in her other; Gallbladder disease in her other; Healthy in her sister; Heart disease in her mother and other; Hypertension in her mother and other; Thyroid disease in her other.   ROS:   Please see the history of present illness.    ROS All other systems reviewed and are negative.   PHYSICAL EXAM:   VS:  BP 132/74   Pulse 90   Ht 5\' 5"  (1.651 m)   Wt 173 lb (78.5 kg)   LMP 05/30/2017   BMI 28.79 kg/m    GEN: Well nourished, well developed, in no acute  distress  HEENT: normal  Neck: no JVD, carotid bruits, or masses Cardiac: RRR; no murmurs, rubs, or gallops,no edema  Respiratory:  clear to auscultation bilaterally, normal work of breathing GI: soft, nontender, nondistended, + BS MS: no deformity or atrophy  Skin: warm and dry, no rash Neuro:  Alert and Oriented x 3, Strength and sensation are intact Psych: euthymic mood, full affect  Wt Readings from Last 3 Encounters:  06/10/17 173 lb (78.5 kg)  02/10/16 170 lb 12.8 oz (77.5 kg)  10/21/15 172 lb 6.4 oz (78.2 kg)      Studies/Labs Reviewed:   EKG:  EKG is ordered today.  The ekg ordered today demonstrates NSR  Recent Labs: No results found for  requested labs within last 8760 hours.   Lipid Panel    Component Value Date/Time   CHOL 242 (H) 10/24/2015 1021   TRIG 181 (H) 10/24/2015 1021   HDL 88 10/24/2015 1021   CHOLHDL 2.8 10/24/2015 1021   LDLCALC 118 10/24/2015 1021    Additional studies/ records that were reviewed today include:   Cardiac CTA 10/2015 FINDINGS: Non-cardiac: No significant non cardiac findings on limited lung and soft tissue windows. See separate report from [facility]. Ascending Aorta:  3.3 cm Pericardium: Normal Coronary arteries: 61 with calcification of mostly the mid and distal LAD IMPRESSION: Coronary calcium score of 61. This was 95th percentile for age and sex matched control. Charlton Haws Electronically Signed   By: Charlton Haws M.D.   On: 10/30/2015 08:18    ASSESSMENT & PLAN:    1. Coronary calcification on CTA - No angina or dyspnea. Continue exercise regimen. Continue aspirin 81 mg daily.   2. Hypertension - Minimally elevated as below                            R                       L Home machine  179/112            165/112 Manual check      140/92              140/94  Recently her blood pressure was elevated during ultrasound workup. Advised to get a new blood pressure machine. We'll increase amlodipine to 7.5 mg daily.  3. Hyperlipidemia - Check LFT and lipid panel (she will come back for fasting labs). Restart Lipitor pending labs.   4. Hypothyroidism - Per PCP. Continue supplement.   5. RUQ pain - per GI   Medication Adjustments/Labs and Tests Ordered: Current medicines are reviewed at length with the patient today.  Concerns regarding medicines are outlined above.  Medication changes, Labs and Tests ordered today are listed in the Patient Instructions below. There are no Patient Instructions on file for this visit.   Lorelei Pont, [place]  06/10/2017 8:35 AM    [facility] [address] [place], [place], [place]   [address] Phone: [phone]; Fax: [phone]

## 2017-06-10 ENCOUNTER — Encounter: Payer: Self-pay | Admitting: Physician Assistant

## 2017-06-10 ENCOUNTER — Ambulatory Visit (INDEPENDENT_AMBULATORY_CARE_PROVIDER_SITE_OTHER): Payer: PRIVATE HEALTH INSURANCE | Admitting: Physician Assistant

## 2017-06-10 VITALS — BP 132/74 | HR 90 | Ht 65.0 in | Wt 173.0 lb

## 2017-06-10 DIAGNOSIS — E782 Mixed hyperlipidemia: Secondary | ICD-10-CM | POA: Diagnosis not present

## 2017-06-10 DIAGNOSIS — E039 Hypothyroidism, unspecified: Secondary | ICD-10-CM

## 2017-06-10 DIAGNOSIS — R1011 Right upper quadrant pain: Secondary | ICD-10-CM

## 2017-06-10 DIAGNOSIS — I10 Essential (primary) hypertension: Secondary | ICD-10-CM | POA: Diagnosis not present

## 2017-06-10 DIAGNOSIS — I2510 Atherosclerotic heart disease of native coronary artery without angina pectoris: Secondary | ICD-10-CM

## 2017-06-10 DIAGNOSIS — I2584 Coronary atherosclerosis due to calcified coronary lesion: Secondary | ICD-10-CM

## 2017-06-10 DIAGNOSIS — E784 Other hyperlipidemia: Secondary | ICD-10-CM

## 2017-06-10 DIAGNOSIS — E785 Hyperlipidemia, unspecified: Secondary | ICD-10-CM | POA: Insufficient documentation

## 2017-06-10 DIAGNOSIS — E7849 Other hyperlipidemia: Secondary | ICD-10-CM

## 2017-06-10 MED ORDER — AMLODIPINE BESYLATE 5 MG PO TABS: 7.5000 mg | ORAL_TABLET | Freq: Every day | ORAL | 3 refills | Status: DC

## 2017-06-10 NOTE — Patient Instructions (Signed)
Your physician has recommended you make the following change in your medication:  1.) increase amlodipine to 7.5 mg (one and half tablets) daily  Your physician recommends that you return for lab work in: in the next week or two (LIPIDS, LIVER FUNCTION)  Your physician wants you to follow-up in: 1 YEAR WITH DR. Tenny CrawOSS.  You will receive a reminder letter in the mail two months in advance. If you don't receive a letter, please call our office to schedule the follow-up appointment.

## 2017-06-11 ENCOUNTER — Other Ambulatory Visit: Payer: PRIVATE HEALTH INSURANCE | Admitting: *Deleted

## 2017-06-11 DIAGNOSIS — E782 Mixed hyperlipidemia: Secondary | ICD-10-CM

## 2017-06-11 DIAGNOSIS — I10 Essential (primary) hypertension: Secondary | ICD-10-CM

## 2017-06-11 LAB — LIPID PANEL
CHOL/HDL RATIO: 4 ratio (ref 0.0–4.4)
Cholesterol, Total: 263 mg/dL — ABNORMAL HIGH (ref 100–199)
HDL: 66 mg/dL (ref >39)
LDL Calculated: 146 mg/dL — ABNORMAL HIGH (ref 0–99)
Triglycerides: 253 mg/dL — ABNORMAL HIGH (ref 0–149)
VLDL CHOLESTEROL CAL: 51 mg/dL — AB (ref 5–40)

## 2017-06-11 LAB — HEPATIC FUNCTION PANEL
ALBUMIN: 4.4 g/dL (ref 3.5–5.5)
ALT: 27 IU/L (ref 0–32)
AST: 26 IU/L (ref 0–40)
Alkaline Phosphatase: 83 IU/L (ref 39–117)
Bilirubin Total: 0.6 mg/dL (ref 0.0–1.2)
Bilirubin, Direct: 0.16 mg/dL (ref 0.00–0.40)
Total Protein: 7 g/dL (ref 6.0–8.5)

## 2017-06-15 ENCOUNTER — Telehealth: Payer: Self-pay | Admitting: *Deleted

## 2017-06-15 MED ORDER — ATORVASTATIN CALCIUM 20 MG PO TABS: 20.0000 mg | ORAL_TABLET | Freq: Every day | ORAL | 3 refills | Status: DC

## 2017-06-15 NOTE — Telephone Encounter (Signed)
-----   Message from Janetta HoraKathryn R Thompson, PA-C sent at 06/11/2017  6:56 PM EDT ----- Covering for Vin. Lipid are elevated. Would start atorva 20mg  daily.

## 2017-06-21 ENCOUNTER — Telehealth: Payer: Self-pay | Admitting: Internal Medicine

## 2017-06-21 DIAGNOSIS — E785 Hyperlipidemia, unspecified: Secondary | ICD-10-CM

## 2017-06-21 MED ORDER — ATORVASTATIN CALCIUM 20 MG PO TABS: 10.0000 mg | ORAL_TABLET | Freq: Every day | ORAL | 3 refills | Status: DC

## 2017-06-21 NOTE — Telephone Encounter (Signed)
Returned call to patient who states she received notification of her lab results from our office last week. She states the PA advised her to start Atorvastatin 20 mg but she prefers to start the dose originally prescribed by Dr. Tenny Crawoss last year which was atorvastatin 5 mg. I reviewed lab results from 6/22 and patient's LDL is 146 mg/dL. I asked if she knows her LDL level when Dr. Tenny Crawoss prescribed the 5 mg which she does not know. I explained how low the dose of atorvastatin 5 mg is and asked if she is willing to try 10 mg. She verbalized agreement with this plan and is scheduled for 8 week repeat lipid and AST on 8/31. She thanked me for the call.

## 2017-06-21 NOTE — Telephone Encounter (Signed)
New message    Pt is calling asking for a call back. She states she has some medication questions.

## 2017-06-28 ENCOUNTER — Other Ambulatory Visit (HOSPITAL_COMMUNITY): Payer: Self-pay | Admitting: Gastroenterology

## 2017-06-28 DIAGNOSIS — R1011 Right upper quadrant pain: Secondary | ICD-10-CM

## 2017-07-09 ENCOUNTER — Encounter (HOSPITAL_COMMUNITY)
Admission: RE | Admit: 2017-07-09 | Discharge: 2017-07-09 | Disposition: A | Discharge status: Home / Self Care | Payer: PRIVATE HEALTH INSURANCE | Source: Ambulatory Visit | Attending: Gastroenterology | Admitting: Gastroenterology

## 2017-07-09 DIAGNOSIS — R1011 Right upper quadrant pain: Secondary | ICD-10-CM | POA: Insufficient documentation

## 2017-07-09 MED ORDER — TECHNETIUM TC 99M MEBROFENIN IV KIT
5.4000 | PACK | Freq: Once | INTRAVENOUS | Status: AC | PRN
  Administered 2017-07-09: 5.4 via INTRAVENOUS

## 2017-08-20 ENCOUNTER — Other Ambulatory Visit: Payer: PRIVATE HEALTH INSURANCE

## 2017-08-31 ENCOUNTER — Encounter (INDEPENDENT_AMBULATORY_CARE_PROVIDER_SITE_OTHER): Payer: Self-pay

## 2017-08-31 ENCOUNTER — Encounter: Payer: Self-pay | Admitting: Physician Assistant

## 2017-08-31 ENCOUNTER — Ambulatory Visit (INDEPENDENT_AMBULATORY_CARE_PROVIDER_SITE_OTHER): Payer: PRIVATE HEALTH INSURANCE | Admitting: Physician Assistant

## 2017-08-31 VITALS — BP 175/105 | HR 114 | Ht 65.0 in | Wt 168.4 lb

## 2017-08-31 DIAGNOSIS — I2510 Atherosclerotic heart disease of native coronary artery without angina pectoris: Secondary | ICD-10-CM

## 2017-08-31 DIAGNOSIS — I10 Essential (primary) hypertension: Secondary | ICD-10-CM

## 2017-08-31 DIAGNOSIS — R000 Tachycardia, unspecified: Secondary | ICD-10-CM | POA: Diagnosis not present

## 2017-08-31 DIAGNOSIS — E785 Hyperlipidemia, unspecified: Secondary | ICD-10-CM

## 2017-08-31 MED ORDER — AMLODIPINE BESYLATE 10 MG PO TABS: 10.0000 mg | ORAL_TABLET | Freq: Every day | ORAL | 5 refills | Status: DC

## 2017-08-31 MED ORDER — SIMVASTATIN 5 MG PO TABS: 5.0000 mg | ORAL_TABLET | Freq: Every day | ORAL | 5 refills | Status: DC

## 2017-08-31 NOTE — Progress Notes (Signed)
Cardiology Office Note    Date:  08/31/2017   ID:  Jodi Reynolds, DOB 1976/11/21, MRN [id number]  [facility]  [facility]  Cardiologist:  Dr. Tenny Crawoss  Chief Complaint: Hypertension follow up  History of Present Illness:   Jodi Reynolds is a 41 y.o. female  hypertension, hyperlipidemia, coronary calcification on CTA of chest (mid & distal LAD), strong family hx of CAD and thyroid disease present for follow-up.  Seen by me 06/10/17. Increased amlodipine to 7.5mg  qd due to elevated BP. Advised to get new BP machine. LDL 146. Advised to start Atorvastatin 20mg  qd however she agreed to start 10mg  qd.   Here today for follow up. Patient had a headache 3-4 days after starting Lipitor followed by ankle swelling around 10th day. No reoccurrence since discontinuation. Recently seen in walk-in clinic for rash at her mid upper chest. Felt some kind of allergic reaction and given when necessary hydroxyzine. Since then her blood pressure running high 150-180/90-110s. She denies chest pain, palpitations, orthopnea, PND, syncope, fever, chills, cough, congestion or dizziness. [place] going to [place] vacation tomorrow.    Past Medical History:  Diagnosis Date  . Abnormal Pap smear   . Coronary artery calcification seen on CT scan    in 2016  . Hyperlipidemia   . Hypertension   . Thyroid disease   . Urinary tract infection     Past Surgical History:  Procedure Laterality Date  . VASECTOMY      Current Medications: Prior to Admission medications   Medication Sig Start Date End Date Taking? Authorizing Provider  amLODipine (NORVASC) 5 MG tablet Take 1.5 tablets (7.5 mg total) by mouth daily. 06/10/17   Manson PasseyBhagat, Doran Nestle, PA  aspirin EC 81 MG tablet Take 1 tablet (81 mg total) by mouth daily. 02/10/16   Pricilla Riffleoss, Paula V, MD  atorvastatin (LIPITOR) 20 MG tablet Take 0.5 tablets (10 mg total) by mouth daily. 06/21/17 09/19/17  Pricilla Riffleoss, Paula V, MD  fluticasone (CUTIVATE) 0.05 % cream  Apply 1 application topically daily as needed (DRY SKIN).  12/20/14   [provider]  SYNTHROID 88 MCG tablet Take 88 mcg by mouth daily. 06/02/17   [provider]    Allergies:   Josefina DoLobster [shellfish allergy] and Other   Social History   Social History  . Marital status: Married    Spouse name: N/A  . Number of children: N/A  . Years of education: N/A   Social History Main Topics  . Smoking status: Never Smoker  . Smokeless tobacco: Never Used  . Alcohol use 0.0 oz/week     Comment: 2/3 drinks per day.  . Drug use: No  . Sexual activity: Yes   Other Topics Concern  . None   Social History Narrative  . None     Family History:  The patient's family history includes Arthritis in her other; Cancer in her other; Gallbladder disease in her other; Healthy in her sister; Heart disease in her mother and other; Hypertension in her mother and other; Thyroid disease in her other.   ROS:   Please see the history of present illness.    ROS All other systems reviewed and are negative.   PHYSICAL EXAM:   VS:  BP (!) 175/105   Pulse (!) 114   Ht 5\' 5"  (1.651 m)   Wt 168 lb 6.4 oz (76.4 kg)   SpO2 99%   BMI 28.02 kg/m    GEN: Well nourished, well developed,  in no acute distress  HEENT: normal  Neck: no JVD, carotid bruits, or masses Cardiac: RRR; no murmurs, rubs, or gallops,no edema  Respiratory:  clear to auscultation bilaterally, normal work of breathing GI: soft, nontender, nondistended, + BS MS: no deformity or atrophy  Skin: warm and dry, diffuse erythema on upper chest  Neuro:  Alert and Oriented x 3, Strength and sensation are intact Psych: euthymic mood, full affect  Wt Readings from Last 3 Encounters:  08/31/17 168 lb 6.4 oz (76.4 kg)  06/10/17 173 lb (78.5 kg)  02/10/16 170 lb 12.8 oz (77.5 kg)      Studies/Labs Reviewed:   EKG:  EKG is ordered today.  The ekg ordered today demonstrates SR rate rate of 97 bpm  Recent Labs: 06/11/2017: ALT  27   Lipid Panel    Component Value Date/Time   CHOL 263 (H) 06/11/2017 1108   CHOL 242 (H) 10/24/2015 1021   TRIG 253 (H) 06/11/2017 1108   TRIG 181 (H) 10/24/2015 1021   HDL 66 06/11/2017 1108   HDL 88 10/24/2015 1021   CHOLHDL 4.0 06/11/2017 1108   CHOLHDL 2.8 10/24/2015 1021   LDLCALC 146 (H) 06/11/2017 1108   LDLCALC 118 10/24/2015 1021    Additional studies/ records that were reviewed today include:   Cardiac CTA 10/2015 FINDINGS: Non-cardiac: No significant non cardiac findings on limited lung and soft tissue windows. See separate report from [facility]. Ascending Aorta: 3.3 cm Pericardium: Normal Coronary arteries: 61 with calcification of mostly the mid and distal LAD IMPRESSION: Coronary calcium score of 61. This was 95th percentile for age and sex matched control. Charlton Haws Electronically Signed By: Charlton Haws M.D. On: 10/30/2015 08:18    ASSESSMENT & PLAN:    1. HTN - Elevated today. Likely due to stress and anxiety. Reassurance given. Increase Norvasc to  qd as she is going out of country on trial basis.   2. HLD - 06/11/2017: Cholesterol, Total 263; HDL 66; LDL Calculated 146; Triglycerides 253  - Intolerance to Liptor. She will start low dose Zocor  after coming back from vacation.   3. Coronary calcification on CTA - No angina or dyspnea. Continue exercise regimen. Continue aspirin 81 mg daily.     Medication Adjustments/Labs and Tests Ordered: Current medicines are reviewed at length with the patient today.  Concerns regarding medicines are outlined above.  Medication changes, Labs and Tests ordered today are listed in the Patient Instructions below. Patient Instructions  Medication Instructions:   START AMLODIPINE 10 MG ONCE A DAY   START  ZOCOR 5 MG ONCE A DAY   If you need a refill on your cardiac medications before your next appointment, please call your pharmacy.  Labwork: NONE ORDERED   TODAY    Testing/Procedures: NONE ORDERED  TODAY    Follow-Up: WITH DR ROSS IN 6 TO 8 WEEKS   Any Other Special Instructions Will Be Listed Below (If Applicable).  Lorelei Pont, [place]  08/31/2017 2:26 PM    [facility] [address] [place], [place], [place]  [address] Phone: [phone]; Fax: [phone]

## 2017-08-31 NOTE — Patient Instructions (Addendum)
Medication Instructions:   START AMLODIPINE 10 MG ONCE A DAY   START  ZOCOR 5 MG ONCE A DAY   If you need a refill on your cardiac medications before your next appointment, please call your pharmacy.  Labwork: NONE ORDERED  TODAY    Testing/Procedures: NONE ORDERED  TODAY    Follow-Up: WITH DR ROSS IN 6 TO 8 WEEKS   Any Other Special Instructions Will Be Listed Below (If Applicable).

## 2017-11-01 ENCOUNTER — Ambulatory Visit: Payer: PRIVATE HEALTH INSURANCE | Admitting: Internal Medicine

## 2018-01-17 ENCOUNTER — Encounter: Payer: Self-pay | Admitting: Internal Medicine

## 2018-01-17 ENCOUNTER — Ambulatory Visit: Payer: PRIVATE HEALTH INSURANCE | Admitting: Internal Medicine

## 2018-01-17 ENCOUNTER — Encounter (INDEPENDENT_AMBULATORY_CARE_PROVIDER_SITE_OTHER): Payer: Self-pay

## 2018-01-17 VITALS — BP 162/104 | HR 108 | Ht 65.0 in | Wt 173.0 lb

## 2018-01-17 DIAGNOSIS — F1010 Alcohol abuse, uncomplicated: Secondary | ICD-10-CM | POA: Diagnosis not present

## 2018-01-17 DIAGNOSIS — I2510 Atherosclerotic heart disease of native coronary artery without angina pectoris: Secondary | ICD-10-CM | POA: Diagnosis not present

## 2018-01-17 DIAGNOSIS — I10 Essential (primary) hypertension: Secondary | ICD-10-CM

## 2018-01-17 DIAGNOSIS — E7849 Other hyperlipidemia: Secondary | ICD-10-CM | POA: Diagnosis not present

## 2018-01-17 NOTE — Patient Instructions (Addendum)
Your physician recommends that you continue on your current medications as directed. Please refer to the Current Medication list given to you today.  Your physician recommends that you return for lab work today (lipids, bmet)  Your physician recommends that you schedule a follow-up appointment in: 6-8 weeks with Dr. Tenny Crawoss.

## 2018-01-17 NOTE — Progress Notes (Signed)
Cardiology Office Note   Date:  01/17/2018   ID:  Jodi Reynolds, DOB 1976-11-24, MRN [id number]  [facility]  [facility]  Cardiologist:   Dietrich Pates, MD    F/U of  HTN     History of Present Illness: Jodi Reynolds is a 42 y.o. female with a history of HTN and HL and coronary calcifications   I saw her  I saw her in Feb 2017   She has been seen by B Bhagat 2 x since   Since I saw her she says she feels good  Breatihng OK   No CP  She has been seen in clinic and explains why each time her BP is elevated  Has not run recently  Weather      Current Outpatient Medications  Medication Sig Dispense Refill  . amLODipine (NORVASC) 5 MG tablet Take 7.5 mg by mouth daily.    . hydrOXYzine (ATARAX/VISTARIL) 25 MG tablet Take 25 mg by mouth as needed.    . simvastatin (ZOCOR) 5 MG tablet Take 1 tablet (5 mg total) by mouth daily. 30 tablet 5  . SYNTHROID 88 MCG tablet Take 88 mcg by mouth daily.     No current facility-administered medications for this visit.     Allergies:   Lipitor [atorvastatin]; Lobster [shellfish allergy]; and Other   Past Medical History:  Diagnosis Date  . Abnormal Pap smear   . Coronary artery calcification seen on CT scan    in 2016  . Hyperlipidemia   . Hypertension   . Thyroid disease   . Urinary tract infection     Past Surgical History:  Procedure Laterality Date  . VASECTOMY       Social History:  The patient  reports that  has never smoked. she has never used smokeless tobacco. She reports that she drinks alcohol. She reports that she does not use drugs.   Family History:  The patient's family history includes Arthritis in her other; Cancer in her other; Gallbladder disease in her other; Healthy in her sister; Heart disease in her mother and other; Hypertension in her mother and other; Thyroid disease in her other.  Father with CAD (CABG)  ROS:  Please see the history of present illness. All other systems are reviewed  and  Negative to the above problem except as noted.    PHYSICAL EXAM: VS:  BP (!) 162/104   Pulse (!) 108   Ht 5\' 5"  (1.651 m)   Wt 173 lb (78.5 kg)   BMI 28.79 kg/m   ON my check 160/100 GEN: Well nourished, well developed, in no acute distress  HEENT: normal  Neck: JVP normal  No, carotid bruits, or masses Cardiac: RRR; no murmurs, rubs, or gallops,no edema  Respiratory:  clear to auscultation bilaterally, normal work of breathing GI: soft, nontender, nondistended, + BS  No hepatomegaly  MS: no deformity Moving all extremities   Skin: warm and dry, no rash Neuro:  Strength and sensation are intact Psych: euthymic mood, full affect   EKG:  EKG is not ordered today.   Lipid Panel    Component Value Date/Time   CHOL 263 (H) 06/11/2017 1108   CHOL 242 (H) 10/24/2015 1021   TRIG 253 (H) 06/11/2017 1108   TRIG 181 (H) 10/24/2015 1021   HDL 66 06/11/2017 1108   HDL 88 10/24/2015 1021   CHOLHDL 4.0 06/11/2017 1108   CHOLHDL 2.8 10/24/2015 1021   LDLCALC 146 (H) 06/11/2017 1108  LDLCALC 118 10/24/2015 1021      Wt Readings from Last 3 Encounters:  01/17/18 173 lb (78.5 kg)  08/31/17 168 lb 6.4 oz (76.4 kg)  06/10/17 173 lb (78.5 kg)      ASSESSMENT AND PLAN:  1  HTN  BP is high today  It has been up on several other outpt visits Will review blood work before coming up with medicine change  2. CAD  Coronary calcifications on CT scan  No symptoms to sugg angina.   3  Lipids On Zocor  Check lipids     4  EtOH  Did not review today  Hx excess EtOH in past        Signed, Dietrich PatesPaula Fatime [place], MD  01/17/2018 3:37 PM    [facility] [address], [place], [place]  [address] Phone: [phone]; Fax: [phone]

## 2018-01-18 LAB — LIPID PANEL
CHOL/HDL RATIO: 3.2 ratio (ref 0.0–4.4)
Cholesterol, Total: 251 mg/dL — ABNORMAL HIGH (ref 100–199)
HDL: 79 mg/dL (ref >39)
LDL Calculated: 120 mg/dL — ABNORMAL HIGH (ref 0–99)
TRIGLYCERIDES: 261 mg/dL — AB (ref 0–149)
VLDL Cholesterol Cal: 52 mg/dL — ABNORMAL HIGH (ref 5–40)

## 2018-01-18 LAB — BASIC METABOLIC PANEL
BUN/Creatinine Ratio: 25 — ABNORMAL HIGH (ref 9–23)
BUN: 12 mg/dL (ref 6–24)
CALCIUM: 9.7 mg/dL (ref 8.7–10.2)
CO2: 24 mmol/L (ref 20–29)
CREATININE: 0.48 mg/dL — AB (ref 0.57–1.00)
Chloride: 96 mmol/L (ref 96–106)
GFR calc Af Amer: 139 mL/min/{1.73_m2} (ref >59)
GFR, EST NON AFRICAN AMERICAN: 121 mL/min/{1.73_m2} (ref >59)
Glucose: 86 mg/dL (ref 65–99)
Potassium: 4.3 mmol/L (ref 3.5–5.2)
Sodium: 139 mmol/L (ref 134–144)

## 2018-01-26 ENCOUNTER — Telehealth: Payer: Self-pay | Admitting: Internal Medicine

## 2018-01-26 DIAGNOSIS — E7849 Other hyperlipidemia: Secondary | ICD-10-CM

## 2018-01-26 DIAGNOSIS — I10 Essential (primary) hypertension: Secondary | ICD-10-CM

## 2018-01-26 MED ORDER — CHLORTHALIDONE 25 MG PO TABS: 12.5000 mg | ORAL_TABLET | Freq: Every day | ORAL | 0 refills | Status: DC

## 2018-01-26 NOTE — Telephone Encounter (Signed)
Returned call to nurse. Instructed patient to START hygroton 12.5 mg once a day. Patient scheduled for BMET on 02/04/18.   Patient states she is currently taking Zorcor 5 mg once a day. Would you like her to increase her Zorcor or switch to Crestor? Informed patient I would forward to Dr. Tenny Crawoss for further recommendations. She verbalized understanding and thanked me for the call.      Notes recorded by Pricilla Riffleoss, Paula V, MD on 01/18/2018 at 3:42 PM EST Electrolytes and kidney function are OK With BP still being increased I would recomm Chlorithalidone 12.5 mg  Check BMET in 10 days Cholesterol is better with LDL of 120 BUt with coronary calcifications. WIth evidence of plaquing I would recomm Crestor 10 mg  Metabolized VERY different from lipitor F/U lpid panel panel in 8 wks

## 2018-01-26 NOTE — Telephone Encounter (Signed)
New message    Patient calling back to speak nurse - labs results

## 2018-01-26 NOTE — Telephone Encounter (Signed)
I would switch to Crestor.  More effective at a lower dose Stop Zocor F/U lipids in 8 wks

## 2018-02-01 MED ORDER — ROSUVASTATIN CALCIUM 10 MG PO TABS: 10.0000 mg | ORAL_TABLET | Freq: Every day | ORAL | 3 refills | Status: DC

## 2018-02-01 NOTE — Addendum Note (Signed)
Addended by: Lendon KaWILSON, Brandalynn Ofallon on: 02/01/2018 10:28 AM   Modules accepted: Orders

## 2018-02-01 NOTE — Telephone Encounter (Signed)
Called patient and advised. She will stop Zocor and start Crestor10 mg daily.  Coming for lab work for chlorthalidone on 2/15.  Asked if she could get a bp check at that time.  Added to nurse room schedule.  Pt coming at 12:30 pm.

## 2018-02-04 ENCOUNTER — Ambulatory Visit: Payer: PRIVATE HEALTH INSURANCE | Admitting: *Deleted

## 2018-02-04 ENCOUNTER — Other Ambulatory Visit: Payer: Self-pay | Admitting: *Deleted

## 2018-02-04 ENCOUNTER — Other Ambulatory Visit: Payer: PRIVATE HEALTH INSURANCE | Admitting: *Deleted

## 2018-02-04 VITALS — BP 142/84

## 2018-02-04 DIAGNOSIS — I10 Essential (primary) hypertension: Secondary | ICD-10-CM

## 2018-02-04 DIAGNOSIS — Z0130 Encounter for examination of blood pressure without abnormal findings: Secondary | ICD-10-CM

## 2018-02-04 LAB — BASIC METABOLIC PANEL
BUN/Creatinine Ratio: 16 (ref 9–23)
BUN: 9 mg/dL (ref 6–24)
CHLORIDE: 90 mmol/L — AB (ref 96–106)
CO2: 26 mmol/L (ref 20–29)
CREATININE: 0.58 mg/dL (ref 0.57–1.00)
Calcium: 9.9 mg/dL (ref 8.7–10.2)
GFR calc Af Amer: 131 mL/min/{1.73_m2} (ref >59)
GFR calc non Af Amer: 113 mL/min/{1.73_m2} (ref >59)
GLUCOSE: 91 mg/dL (ref 65–99)
Potassium: 3.3 mmol/L — ABNORMAL LOW (ref 3.5–5.2)
SODIUM: 134 mmol/L (ref 134–144)

## 2018-02-04 MED ORDER — POTASSIUM CHLORIDE ER 10 MEQ PO TBCR: 10.0000 meq | EXTENDED_RELEASE_TABLET | Freq: Every day | ORAL | 3 refills | Status: DC

## 2018-02-04 NOTE — Progress Notes (Signed)
1.) Reason for visit: BP check  2.) Name of MD requesting visit: Pt asked for this BP check  3.) H&P: BP elevated at last OV 01/17/18--162/104; pt had labs then started on chlorthalidone 12.5 mg daily   4.) ROS related to problem: Pt here today for repeat labs (came early due to going [place].  Has taken 6 doses of chlorthalidone 12.5 mg. (NOT TAKEN TODAY YET)  She brought her new BP cuff with her:     Auscultation of   Right arm: 142/84 Left arm: 146/88  Pt Cuff right arm x2:  161/99, 153/96  5.) Assessment and plan per MD: routing to Dr. Tenny Crawoss for review.  Advised patient to continue same dose of medicine for now, since she has only had 6 doses and has not taken it yet today.

## 2018-03-04 ENCOUNTER — Encounter: Payer: Self-pay | Admitting: Internal Medicine

## 2018-03-14 ENCOUNTER — Ambulatory Visit: Payer: PRIVATE HEALTH INSURANCE | Admitting: Internal Medicine

## 2018-04-06 ENCOUNTER — Ambulatory Visit (INDEPENDENT_AMBULATORY_CARE_PROVIDER_SITE_OTHER): Payer: PRIVATE HEALTH INSURANCE | Admitting: Physician Assistant

## 2018-04-06 ENCOUNTER — Encounter: Payer: Self-pay | Admitting: Physician Assistant

## 2018-04-06 VITALS — BP 164/98 | HR 116 | Ht 65.0 in | Wt 173.0 lb

## 2018-04-06 DIAGNOSIS — E7849 Other hyperlipidemia: Secondary | ICD-10-CM

## 2018-04-06 DIAGNOSIS — I10 Essential (primary) hypertension: Secondary | ICD-10-CM

## 2018-04-06 DIAGNOSIS — I2510 Atherosclerotic heart disease of native coronary artery without angina pectoris: Secondary | ICD-10-CM

## 2018-04-06 DIAGNOSIS — R000 Tachycardia, unspecified: Secondary | ICD-10-CM | POA: Diagnosis not present

## 2018-04-06 LAB — BASIC METABOLIC PANEL
BUN / CREAT RATIO: 15 (ref 9–23)
BUN: 8 mg/dL (ref 6–24)
CALCIUM: 9.7 mg/dL (ref 8.7–10.2)
CO2: 25 mmol/L (ref 20–29)
CREATININE: 0.53 mg/dL — AB (ref 0.57–1.00)
Chloride: 94 mmol/L — ABNORMAL LOW (ref 96–106)
GFR calc Af Amer: 134 mL/min/{1.73_m2} (ref >59)
GFR calc non Af Amer: 117 mL/min/{1.73_m2} (ref >59)
GLUCOSE: 96 mg/dL (ref 65–99)
Potassium: 3.9 mmol/L (ref 3.5–5.2)
Sodium: 135 mmol/L (ref 134–144)

## 2018-04-06 LAB — TSH: TSH: 1.57 u[IU]/mL (ref 0.450–4.500)

## 2018-04-06 MED ORDER — POTASSIUM CHLORIDE CRYS ER 20 MEQ PO TBCR: 20.0000 meq | EXTENDED_RELEASE_TABLET | Freq: Every day | ORAL | 3 refills | Status: DC

## 2018-04-06 MED ORDER — CHLORTHALIDONE 25 MG PO TABS: 25.0000 mg | ORAL_TABLET | Freq: Every day | ORAL | 3 refills | Status: DC

## 2018-04-06 MED ORDER — ROSUVASTATIN CALCIUM 10 MG PO TABS: 10.0000 mg | ORAL_TABLET | Freq: Every day | ORAL | 3 refills | Status: DC

## 2018-04-06 NOTE — Patient Instructions (Addendum)
Medication Instructions:  1. Increase Chlorthalidone to 25 mg Once daily   2. Increase potassium to 20 mEq Once daily   Labwork: 1. Today - BMET, TSH  2. IN 2 weeks - BMET  Testing/Procedures: None   Follow-Up: Aaryan Essman, PAC ON May 02, 2018 @ 11;45   Any Other Special Instructions Will Be Listed Below (If Applicable).  If you need a refill on your cardiac medications before your next appointment, please call your pharmacy.

## 2018-04-06 NOTE — Progress Notes (Signed)
Cardiology Office Note:    Date:  04/06/2018   ID:  Jodi Reynolds, DOB [birth year], MRN [id number]  [facility]  [facility]  Cardiologist:  Dietrich PatesPaula Ross, MD   Referring MD: [place], [place]*   Chief Complaint  Patient presents with  . Follow-up    BP    History of Present Illness:    Jodi Reynolds is a 42 y.o. female with coronary artery calcification, hypertension, hyperlipidemia.  Last seen by Dr. Tenny Crawoss 01/17/18.  Chlorthalidone was added for blood pressure.  Also her simvastatin was changed to rosuvastatin.  Jodi Reynolds returns for follow up on blood pressure.  She is doing well.  She denies chest pain, shortness of breath, syncope, orthopnea, PND or significant pedal edema. BP at home runs 140s/90s.    Prior CV studies:   The following studies were reviewed today:  Cardiac CT 10/24/15 IMPRESSION: Coronary calcium score of 61. This was 95th percentile for age and sex matched control.  Past Medical History:  Diagnosis Date  . Abnormal Pap smear   . Coronary artery calcification seen on CT scan    in 2016  . Hyperlipidemia   . Hypertension   . Thyroid disease   . Urinary tract infection    Surgical Hx: The patient  has no past surgical history on file.   Current Medications: Current Meds  Medication Sig  . amLODipine (NORVASC) 5 MG tablet Take 7.5 mg by mouth daily.  . potassium chloride (K-DUR) 10 MEQ tablet Take 1 tablet (10 mEq total) by mouth daily.  Marland Kitchen. SYNTHROID 88 MCG tablet Take 88 mcg by mouth daily.  . [DISCONTINUED] chlorthalidone (HYGROTON) 25 MG tablet Take 0.5 tablets (12.5 mg total) by mouth daily.     Allergies:   Lipitor [atorvastatin]; Lobster [shellfish allergy]; and Other   Social History   Tobacco Use  . Smoking status: Never Smoker  . Smokeless tobacco: Never Used  Substance Use Topics  . Alcohol use: Yes    Alcohol/week: 0.0 oz    Comment: 2/3 drinks per day.  . Drug use: No     Family Hx: The patient's family  history includes Arthritis in her other; Cancer in her other; Gallbladder disease in her other; Healthy in her sister; Heart disease in her mother and other; Hypertension in her mother and other; Thyroid disease in her other.  ROS:   Please see the history of present illness.    ROS All other systems reviewed and are negative.   EKGs/Labs/Other Test Reviewed:    EKG:  EKG is  ordered today.  The ekg ordered today demonstrates sinus tachycardia, heart rate 116, inferior Q waves, QTC 455, no change since 08/31/17  Recent Labs: 06/11/2017: ALT 27 02/04/2018: BUN 9; Creatinine, Ser 0.58; Potassium 3.3; Sodium 134   Recent Lipid Panel Lab Results  Component Value Date/Time   CHOL 251 (H) 01/17/2018 04:07 PM   CHOL 242 (H) 10/24/2015 10:21 AM   TRIG 261 (H) 01/17/2018 04:07 PM   TRIG 181 (H) 10/24/2015 10:21 AM   HDL 79 01/17/2018 04:07 PM   HDL 88 10/24/2015 10:21 AM   CHOLHDL 3.2 01/17/2018 04:07 PM   CHOLHDL 2.8 10/24/2015 10:21 AM   LDLCALC 120 (H) 01/17/2018 04:07 PM   LDLCALC 118 10/24/2015 10:21 AM    Physical Exam:    VS:  BP (!) 164/98   Pulse (!) 116   Ht 5\' 5"  (1.651 m)   Wt 173 lb (78.5 kg)   BMI 28.79  kg/m     Wt Readings from Last 3 Encounters:  04/06/18 173 lb (78.5 kg)  01/17/18 173 lb (78.5 kg)  08/31/17 168 lb 6.4 oz (76.4 kg)     Physical Exam  Constitutional: She is oriented to person, place, and time. She appears well-developed and well-nourished. No distress.  HENT:  Head: Normocephalic and atraumatic.  Neck: Neck supple. No JVD present.  Cardiovascular: Normal rate, regular rhythm, S1 normal, S2 normal and normal heart sounds.  No murmur heard. Pulmonary/Chest: Effort normal. She has no rhonchi. She has no rales.  Abdominal: Soft. There is no hepatomegaly.  Musculoskeletal: She exhibits no edema.  Neurological: She is alert and oriented to person, place, and time.  Skin: Skin is warm and dry.    ASSESSMENT & PLAN:    #1.  Essential  hypertension Blood pressure remains uncontrolled.  She does have a high baseline heart rate.  We could consider the addition of a beta-blocker or possibly an ACE inhibitor.  She is quite active and runs a lot.  I am concerned that a beta-blocker might cause her significant symptoms.  She has no plans for bearing children.  Therefore an ACE inhibitor is reasonable choice.  For now, I have recommended increasing her chlorthalidone to 25 mg daily.  I have asked her to continue to monitor blood pressure and follow-up in 4 weeks.  -Increase chlorthalidone to 25 mg daily  -Increase potassium to 20 mg once daily  -BMET today, repeat 2 weeks  #2.  Other hyperlipidemia She was switched to Rosuvastatin.,  Her pharmacy has still not filled it.  We will try to correct this with her pharmacy.  #3.  Coronary artery calcification seen on CAT scan No angina.  Continue statin.  #4.  Sinus tachycardia  She has a high basal heart rate.  She denies any symptoms.  She denies any recent illnesses or unusual bleeding.  TSH has not been checked since October.  -Obtain BMET, TSH today.  -Continue to monitor.  Dispo:  Return in about 1 month (around 05/04/2018) for Routine Follow Up, w/ Dr. Tenny Craw, or Jodi Newcomer, PA-C.   Medication Adjustments/Labs and Tests Ordered: Current medicines are reviewed at length with the patient today.  Concerns regarding medicines are outlined above.  Tests Ordered: Orders Placed This Encounter  Procedures  . Basic Metabolic Panel (BMET)  . TSH  . Basic Metabolic Panel (BMET)  . EKG 12-Lead   Medication Changes: Meds ordered this encounter  Medications  . chlorthalidone (HYGROTON) 25 MG tablet    Sig: Take 1 tablet (25 mg total) by mouth daily.    Dispense:  90 tablet    Refill:  3    DOSE INCREASE  . potassium chloride SA (KLOR-CON M20) 20 MEQ tablet    Sig: Take 1 tablet (20 mEq total) by mouth daily.    Dispense:  90 tablet    Refill:  3    DOSE INCREASE  .  rosuvastatin (CRESTOR) 10 MG tablet    Sig: Take 1 tablet (10 mg total) by mouth daily.    Dispense:  90 tablet    Refill:  3    Signed, Jodi Newcomer, PA-C  04/06/2018 10:42 AM    [facility] [address] [place], [place], [place]  [address] Phone: [phone]; Fax: [phone]

## 2018-04-20 ENCOUNTER — Telehealth: Payer: Self-pay | Admitting: *Deleted

## 2018-04-20 ENCOUNTER — Other Ambulatory Visit: Payer: PRIVATE HEALTH INSURANCE | Admitting: *Deleted

## 2018-04-20 DIAGNOSIS — I10 Essential (primary) hypertension: Secondary | ICD-10-CM

## 2018-04-20 LAB — BASIC METABOLIC PANEL
BUN/Creatinine Ratio: 16 (ref 9–23)
BUN: 8 mg/dL (ref 6–24)
CALCIUM: 9.5 mg/dL (ref 8.7–10.2)
CHLORIDE: 88 mmol/L — AB (ref 96–106)
CO2: 28 mmol/L (ref 20–29)
Creatinine, Ser: 0.51 mg/dL — ABNORMAL LOW (ref 0.57–1.00)
GFR calc non Af Amer: 118 mL/min/{1.73_m2} (ref >59)
GFR, EST AFRICAN AMERICAN: 136 mL/min/{1.73_m2} (ref >59)
Glucose: 95 mg/dL (ref 65–99)
POTASSIUM: 3.2 mmol/L — AB (ref 3.5–5.2)
Sodium: 135 mmol/L (ref 134–144)

## 2018-04-20 MED ORDER — POTASSIUM CHLORIDE CRYS ER 20 MEQ PO TBCR: 40.0000 meq | EXTENDED_RELEASE_TABLET | Freq: Every day | ORAL | 3 refills | Status: DC

## 2018-04-20 NOTE — Telephone Encounter (Signed)
Pt has been notified of lab results by phone with verbal understanding. Pt agreeable to increase K+ to 40 meq daily. BMET to be done at appt 05/02/18 with Lorin Picket W. PA. Pt thanked me for the call.

## 2018-04-20 NOTE — Telephone Encounter (Signed)
-----   Message from Beatrice Lecher, PA-C sent at 04/20/2018  4:27 PM EDT ----- Potassium low.  Renal function normal. PLAN: 1.  Increase potassium to 40 mEq daily 2.  BMET 1 week Tereso Newcomer, PA-C    04/20/2018 4:26 PM

## 2018-05-02 ENCOUNTER — Ambulatory Visit: Payer: PRIVATE HEALTH INSURANCE | Admitting: Physician Assistant

## 2018-05-27 ENCOUNTER — Encounter: Payer: Self-pay | Admitting: Cardiology

## 2018-05-27 ENCOUNTER — Telehealth: Payer: Self-pay

## 2018-05-27 ENCOUNTER — Encounter

## 2018-05-27 ENCOUNTER — Encounter (INDEPENDENT_AMBULATORY_CARE_PROVIDER_SITE_OTHER): Payer: Self-pay

## 2018-05-27 ENCOUNTER — Ambulatory Visit: Payer: PRIVATE HEALTH INSURANCE | Admitting: Cardiology

## 2018-05-27 VITALS — BP 152/90 | HR 108 | Ht 65.0 in | Wt 170.0 lb

## 2018-05-27 DIAGNOSIS — I517 Cardiomegaly: Secondary | ICD-10-CM

## 2018-05-27 DIAGNOSIS — E876 Hypokalemia: Secondary | ICD-10-CM | POA: Diagnosis not present

## 2018-05-27 DIAGNOSIS — I10 Essential (primary) hypertension: Secondary | ICD-10-CM

## 2018-05-27 DIAGNOSIS — I2510 Atherosclerotic heart disease of native coronary artery without angina pectoris: Secondary | ICD-10-CM

## 2018-05-27 DIAGNOSIS — R000 Tachycardia, unspecified: Secondary | ICD-10-CM

## 2018-05-27 DIAGNOSIS — E7849 Other hyperlipidemia: Secondary | ICD-10-CM | POA: Diagnosis not present

## 2018-05-27 DIAGNOSIS — R9431 Abnormal electrocardiogram [ECG] [EKG]: Secondary | ICD-10-CM

## 2018-05-27 LAB — BASIC METABOLIC PANEL
BUN / CREAT RATIO: 17 (ref 9–23)
BUN: 8 mg/dL (ref 6–24)
CHLORIDE: 90 mmol/L — AB (ref 96–106)
CO2: 27 mmol/L (ref 20–29)
CREATININE: 0.48 mg/dL — AB (ref 0.57–1.00)
Calcium: 9.3 mg/dL (ref 8.7–10.2)
GFR calc non Af Amer: 121 mL/min/{1.73_m2} (ref >59)
GFR, EST AFRICAN AMERICAN: 139 mL/min/{1.73_m2} (ref >59)
Glucose: 91 mg/dL (ref 65–99)
Potassium: 3 mmol/L — ABNORMAL LOW (ref 3.5–5.2)
Sodium: 134 mmol/L (ref 134–144)

## 2018-05-27 MED ORDER — LOSARTAN POTASSIUM 25 MG PO TABS: 25.0000 mg | ORAL_TABLET | Freq: Every day | ORAL | 3 refills | Status: DC

## 2018-05-27 NOTE — Patient Instructions (Addendum)
Medication Instructions: Your physician has recommended you make the following change in your medication: START: Losartan 25 mg taking 1 tablet by mouth daily   Labwork: TODAY: BMET   Return to our office on 06/01/18 for a repeat BMET  Procedures/Testing: None Ordered  Follow-Up: Keep the follow up appointment that you have with Dr.Ross on 06/16/18   Any Additional Special Instructions Will Be Listed Below (If Applicable).     If you need a refill on your cardiac medications before your next appointment, please call your pharmacy.

## 2018-05-27 NOTE — Progress Notes (Signed)
Cardiology Office Note:    Date:  05/27/2018   ID:  Jodi Reynolds, DOB [birth year], MRN [id number]  [facility]P:  [facility]  Cardiologist:  Dietrich Pates, MD  Referring MD: Roe Coombs*   Chief Complaint  Patient presents with  . Follow-up    Hypertension    History of Present Illness:    Jodi Reynolds is a 42 y.o. female with a past medical history significant for coronary artery calcification with calcium score of 61, hypertension, hyperlipidemia.   She was last seen in the office on 04/06/2018 by Tereso Newcomer, PA at which time they were working on improved blood pressure and her chlorthalidone was increased to 25 mg daily.  Follow-up labs on 04/20/2018 showed potassium of 3.2 and her K-Dur was increased. Her ankles swelled with amlodipine 10 mg in the past and she is now on 7.5 mg.   She is here today for follow-up of her blood pressure.  No complatints, no lightheadedness. Her home BP's have been in the 140's.   She wears a [employer] and notes that her heart rates are generally in the low 90's. She Runs about 3 times per week, 2-3 miles. She has done about 10 half marathons and a full marathon and some 30K races.   She tries to limit her sodium intake.    Past Medical History:  Diagnosis Date  . Abnormal Pap smear   . Coronary artery calcification seen on CT scan    in 2016  . Hyperlipidemia   . Hypertension   . Thyroid disease   . Urinary tract infection     History reviewed. No pertinent surgical history.  Current Medications: Current Meds  Medication Sig  . amLODipine (NORVASC) 5 MG tablet Take 7.5 mg by mouth daily.  . chlorthalidone (HYGROTON) 25 MG tablet Take 1 tablet (25 mg total) by mouth daily.  . potassium chloride SA (KLOR-CON M20) 20 MEQ tablet Take 2 tablets (40 mEq total) by mouth daily.  . rosuvastatin (CRESTOR) 10 MG tablet Take 1 tablet (10 mg total) by mouth daily.  Marland Kitchen SYNTHROID 88 MCG tablet Take 88 mcg by mouth daily.      Allergies:   Lipitor [atorvastatin]; Lobster [shellfish allergy]; and Other   Social History   Socioeconomic History  . Marital status: Married    Spouse name: Not on file  . Number of children: Not on file  . Years of education: Not on file  . Highest education level: Not on file  Occupational History  . Not on file  Social Needs  . Financial resource strain: Not on file  . Food insecurity:    Worry: Not on file    Inability: Not on file  . Transportation needs:    Medical: Not on file    Non-medical: Not on file  Tobacco Use  . Smoking status: Never Smoker  . Smokeless tobacco: Never Used  Substance and Sexual Activity  . Alcohol use: Yes    Alcohol/week: 0.0 oz    Comment: 2/3 drinks per day.  . Drug use: No  . Sexual activity: Yes  Lifestyle  . Physical activity:    Days per week: Not on file    Minutes per session: Not on file  . Stress: Not on file  Relationships  . Social connections:    Talks on phone: Not on file    Gets together: Not on file    Attends religious service: Not on file    Active member  of club or organization: Not on file    Attends meetings of clubs or organizations: Not on file    Relationship status: Not on file  Other Topics Concern  . Not on file  Social History Narrative  . Not on file     Family History: The patient's family history includes Arthritis in her other; Cancer in her other; Gallbladder disease in her other; Healthy in her sister; Heart disease in her mother and other; Hypertension in her mother and other; Thyroid disease in her other. ROS:   Please see the history of present illness.     All other systems reviewed and are negative.  EKGs/Labs/Other Studies Reviewed:    The following studies were reviewed today:  Cardiac CT 10/24/15 IMPRESSION: Coronary calcium score of 61. This was 95th percentile for age and sex matched control.   EKG:  EKG is not ordered today.  The ekg ordered today demonstrates sinus  tachycardia with LVH, 108 bpm.   Recent Labs: 06/11/2017: ALT 27 04/06/2018: TSH 1.570 04/20/2018: BUN 8; Creatinine, Ser 0.51; Potassium 3.2; Sodium 135   Recent Lipid Panel    Component Value Date/Time   CHOL 251 (H) 01/17/2018 1607   CHOL 242 (H) 10/24/2015 1021   TRIG 261 (H) 01/17/2018 1607   TRIG 181 (H) 10/24/2015 1021   HDL 79 01/17/2018 1607   HDL 88 10/24/2015 1021   CHOLHDL 3.2 01/17/2018 1607   CHOLHDL 2.8 10/24/2015 1021   LDLCALC 120 (H) 01/17/2018 1607   LDLCALC 118 10/24/2015 1021    Physical Exam:    VS:  BP (!) 152/90   Pulse (!) 108   Ht 5\' 5"  (1.651 m)   Wt 170 lb (77.1 kg)   SpO2 98%   BMI 28.29 kg/m     Wt Readings from Last 3 Encounters:  05/27/18 170 lb (77.1 kg)  04/06/18 173 lb (78.5 kg)  01/17/18 173 lb (78.5 kg)     Physical Exam  Constitutional: She is oriented to person, place, and time. She appears well-developed and well-nourished. No distress.  HENT:  Head: Normocephalic and atraumatic.  Neck: Normal range of motion. Neck supple. No JVD present.  Cardiovascular: Normal rate, regular rhythm, normal heart sounds and intact distal pulses. Exam reveals no gallop and no friction rub.  No murmur heard. Pulmonary/Chest: Effort normal and breath sounds normal. No respiratory distress. She has no wheezes. She has no rales.  Abdominal: Soft. Bowel sounds are normal.  Musculoskeletal: Normal range of motion. She exhibits no edema or deformity.  Neurological: She is alert and oriented to person, place, and time.  Skin: Skin is warm and dry.  Psychiatric: She has a normal mood and affect. Her behavior is normal. Thought content normal.  Vitals reviewed.    ASSESSMENT:    1. Essential hypertension   2. Hypokalemia due to loss of potassium   3. Coronary artery calcification seen on CAT scan   4. Other hyperlipidemia    PLAN:    In order of problems listed above:  Hypertension: On Amlodipine 7.5 mg, chlorthalidone 25 mg (recently  increased). BP still running high, SBP in the 140's at home.  Heart rate is also on the higher side, 90's. Could use carvedilol to help BP and blood pressure, but pt is very active and a bb may cause her symptoms of fatigue and interfere with her activities. I will add losartan 25 mg and can titrate up as needed. She has an appointment with Dr. Tenny Craw on  6/27. The patient monitors her BP at home and will let us know if it gets to low (unlikely). Can consolidate chlorthalidone and losartan in the future if we get to an adequate dose response.   Hypokalemia: With increase in chlorthalidone. K-Dur was increased to 40 mEq daily.  Will recheck Bmet today and in a week to monitor for potassium with initiation of losartan. If K+ on the higher side today will reduce Kdur.   Coronary artery calcification: Seen on cardiac CTA with calcium score of 61.  She continues on statin and working on blood pressure control. She is very active with running.   Hyperlipidemia: On rosuvastatin 10 mg daily.  LDL was 120 in 12/2017  Hypothyroid: On synthroid. TSH in April was 1.570    Medication Adjustments/Labs and Tests Ordered: Current medicines are reviewed at length with the patient today.  Concerns regarding medicines are outlined above. Labs and tests ordered and medication changes are outlined in the patient instructions below:  Patient Instructions  Medication Instructions: Your physician has recommended you make the following change in your medication: START: Losartan 25 mg taking 1 tablet by mouth daily   Labwork: TODAY: BMET   Return to our office on 06/01/18 for a repeat BMET  Procedures/Testing: None Ordered  Follow-Up: Keep the follow up appointment that you have with Dr.Ross on 06/16/18   Any Additional Special Instructions Will Be Listed Below (If Applicable).     If you need a refill on your cardiac medications before your next appointment, please call your pharmacy.      Signed, Berton BonJanine  Julena Barbour, NP  05/27/2018 12:49 PM    [facility]

## 2018-05-27 NOTE — Telephone Encounter (Signed)
Pt had bmet drawn this morning and potassium came back at a 3.0 Per Berton BonJanine Hammond to Decrease her Chlorthalidone to 12.5 mg daily and to take a extra 40 meq of her potassium today and tomorrow she wants her to take 40 meq bid. After that Pt will continue taking 40 meq of her potassium . Pt was scheduled to come in for a repeat bmet on 06/01/18 but will come in on 05/30/18 instead. I Spoke with pt and notified her of results and medications changes. Pt verbalized understanding.

## 2018-05-30 ENCOUNTER — Other Ambulatory Visit: Payer: PRIVATE HEALTH INSURANCE

## 2018-05-31 ENCOUNTER — Telehealth: Payer: Self-pay

## 2018-05-31 ENCOUNTER — Other Ambulatory Visit: Payer: PRIVATE HEALTH INSURANCE

## 2018-05-31 DIAGNOSIS — I10 Essential (primary) hypertension: Secondary | ICD-10-CM

## 2018-05-31 NOTE — Telephone Encounter (Signed)
Spoke with pt per J Hammond's request and she will be coming today for her repeat BMET.

## 2018-06-01 ENCOUNTER — Other Ambulatory Visit: Payer: PRIVATE HEALTH INSURANCE

## 2018-06-01 ENCOUNTER — Telehealth: Payer: Self-pay

## 2018-06-01 LAB — BASIC METABOLIC PANEL
BUN/Creatinine Ratio: 14 (ref 9–23)
BUN: 8 mg/dL (ref 6–24)
CALCIUM: 10 mg/dL (ref 8.7–10.2)
CHLORIDE: 92 mmol/L — AB (ref 96–106)
CO2: 25 mmol/L (ref 20–29)
Creatinine, Ser: 0.59 mg/dL (ref 0.57–1.00)
GFR calc Af Amer: 130 mL/min/{1.73_m2} (ref >59)
GFR calc non Af Amer: 113 mL/min/{1.73_m2} (ref >59)
GLUCOSE: 96 mg/dL (ref 65–99)
Potassium: 3.8 mmol/L (ref 3.5–5.2)
Sodium: 137 mmol/L (ref 134–144)

## 2018-06-01 MED ORDER — POTASSIUM CHLORIDE CRYS ER 20 MEQ PO TBCR: 40.0000 meq | EXTENDED_RELEASE_TABLET | Freq: Every day | ORAL | 3 refills | Status: DC

## 2018-06-01 NOTE — Telephone Encounter (Signed)
Refill sent in

## 2018-06-01 NOTE — Telephone Encounter (Signed)
-----   Message from Berton BonJanine Hammond, NP sent at 06/01/2018 10:21 AM EDT ----- I called pt to inform her of her normal potassium results. She had taken the extra doses of KDur on Friday and Saturday and is now taking 40mEq daily. She is feeling well on the lower dose of chlorthalidone and addition of losartan. She has a follow up with Dr. Tenny Crawoss in a couple of weeks. She needs a refill on KDur 40mEq daily.  Berton BonJanine Hammond, NP

## 2018-06-07 ENCOUNTER — Telehealth: Payer: Self-pay

## 2018-06-07 NOTE — Telephone Encounter (Signed)
Called pt and left a message informing her that her medication was already sent to her pharmacy and if she had any other problems to call the office.

## 2018-06-16 ENCOUNTER — Ambulatory Visit: Payer: PRIVATE HEALTH INSURANCE | Admitting: Internal Medicine

## 2018-06-16 ENCOUNTER — Encounter: Payer: Self-pay | Admitting: Internal Medicine

## 2018-06-16 VITALS — BP 154/98 | HR 110 | Ht 65.0 in | Wt 169.8 lb

## 2018-06-16 DIAGNOSIS — E039 Hypothyroidism, unspecified: Secondary | ICD-10-CM | POA: Diagnosis not present

## 2018-06-16 DIAGNOSIS — E876 Hypokalemia: Secondary | ICD-10-CM

## 2018-06-16 DIAGNOSIS — I10 Essential (primary) hypertension: Secondary | ICD-10-CM

## 2018-06-16 LAB — BASIC METABOLIC PANEL
BUN / CREAT RATIO: 17 (ref 9–23)
BUN: 10 mg/dL (ref 6–24)
CO2: 26 mmol/L (ref 20–29)
CREATININE: 0.58 mg/dL (ref 0.57–1.00)
Calcium: 9.8 mg/dL (ref 8.7–10.2)
Chloride: 92 mmol/L — ABNORMAL LOW (ref 96–106)
GFR calc Af Amer: 131 mL/min/{1.73_m2} (ref >59)
GFR, EST NON AFRICAN AMERICAN: 113 mL/min/{1.73_m2} (ref >59)
Glucose: 85 mg/dL (ref 65–99)
Potassium: 4 mmol/L (ref 3.5–5.2)
SODIUM: 135 mmol/L (ref 134–144)

## 2018-06-16 LAB — CBC
HEMATOCRIT: 41.6 % (ref 34.0–46.6)
Hemoglobin: 14.1 g/dL (ref 11.1–15.9)
MCH: 30.8 pg (ref 26.6–33.0)
MCHC: 33.9 g/dL (ref 31.5–35.7)
MCV: 91 fL (ref 79–97)
Platelets: 361 10*3/uL (ref 150–450)
RBC: 4.58 x10E6/uL (ref 3.77–5.28)
RDW: 13.7 % (ref 12.3–15.4)
WBC: 6.8 10*3/uL (ref 3.4–10.8)

## 2018-06-16 MED ORDER — LOSARTAN POTASSIUM 50 MG PO TABS: 50.0000 mg | ORAL_TABLET | Freq: Every day | ORAL | 3 refills | Status: DC

## 2018-06-16 NOTE — Patient Instructions (Addendum)
Your physician has recommended you make the following change in your medication:  1.) STOP CHLORTHALIDONE 2.) STOP POTASSIUM 3.) INCREASE LOSARTAN TO 50 MG DAILY  Your physician recommends that you return for lab work today (CBC, BMET)

## 2018-06-16 NOTE — Progress Notes (Signed)
Cardiology Office Note:    Date:  06/16/2018   ID:  Jodi Reynolds, DOB 06/07/1976, MRN [id number]  PCP:  [facility]  Cardiologist:  Dietrich Pates, MD  Referring MD: Roe Coombs*   Pt presents for f/u of HTN    History of Present Illness:    Jodi Reynolds is a 42 y.o. female with a past medical history significant for coronary artery calcification with calcium score of 61, hypertension, hyperlipidemia.  She was seen most recenlty by Wende Mott in April   At that time Chlorathaldone increased to 25 mg   After this change BMET showed low K    Her potassium supplement was increased   IN addition, amlodipine was decreased to 7.5 mg due to LE edema She then saw n Jeraldine Loots and Cozaar was added to her regimen  Today she returns for f/u   She says she feels good   No CP   Breathing is OK Runs about 3 to  5 miles regularly     Past Medical History:  Diagnosis Date  . Abnormal Pap smear   . Coronary artery calcification seen on CT scan    in 2016  . Hyperlipidemia   . Hypertension   . Thyroid disease   . Urinary tract infection     History reviewed. No pertinent surgical history.  Current Medications: Current Meds  Medication Sig  . amLODipine (NORVASC) 5 MG tablet Take 7.5 mg by mouth daily.  . chlorthalidone (HYGROTON) 25 MG tablet Take 1 tablet (25 mg total) by mouth daily.  Marland Kitchen losartan (COZAAR) 25 MG tablet Take 1 tablet (25 mg total) by mouth daily.  . potassium chloride SA (KLOR-CON M20) 20 MEQ tablet Take 2 tablets (40 mEq total) by mouth daily.  . rosuvastatin (CRESTOR) 10 MG tablet Take 1 tablet (10 mg total) by mouth daily.  Marland Kitchen SYNTHROID 88 MCG tablet Take 88 mcg by mouth daily.     Allergies:   Lipitor [atorvastatin]; Lobster [shellfish allergy]; and Other   Social History   Socioeconomic History  . Marital status: Married    Spouse name: Not on file  . Number of children: Not on file  . Years of education: Not on file  . Highest  education level: Not on file  Occupational History  . Not on file  Social Needs  . Financial resource strain: Not on file  . Food insecurity:    Worry: Not on file    Inability: Not on file  . Transportation needs:    Medical: Not on file    Non-medical: Not on file  Tobacco Use  . Smoking status: Never Smoker  . Smokeless tobacco: Never Used  Substance and Sexual Activity  . Alcohol use: Yes    Alcohol/week: 0.0 oz    Comment: 2/3 drinks per day.  . Drug use: No  . Sexual activity: Yes  Lifestyle  . Physical activity:    Days per week: Not on file    Minutes per session: Not on file  . Stress: Not on file  Relationships  . Social connections:    Talks on phone: Not on file    Gets together: Not on file    Attends religious service: Not on file    Active member of club or organization: Not on file    Attends meetings of clubs or organizations: Not on file    Relationship status: Not on file  Other Topics Concern  . Not on file  Social History Narrative  . Not on file     Family History: The patient's family history includes Arthritis in her other; Cancer in her other; Gallbladder disease in her other; Healthy in her sister; Heart disease in her mother and other; Hypertension in her mother and other; Thyroid disease in her other. ROS:   Please see the history of present illness.     All other systems reviewed and are negative.  EKGs/Labs/Other Studies Reviewed:    The following studies were reviewed today:  Cardiac CT 10/24/15 IMPRESSION: Coronary calcium score of 61. This was 95th percentile for age and sex matched control.   EKG:  EKG is not ordered today.  The ekg ordered today demonstrates sinus tachycardia with LVH, 108 bpm.   Recent Labs: 04/06/2018: TSH 1.570 05/31/2018: BUN 8; Creatinine, Ser 0.59; Potassium 3.8; Sodium 137   Recent Lipid Panel    Component Value Date/Time   CHOL 251 (H) 01/17/2018 1607   CHOL 242 (H) 10/24/2015 1021   TRIG 261  (H) 01/17/2018 1607   TRIG 181 (H) 10/24/2015 1021   HDL 79 01/17/2018 1607   HDL 88 10/24/2015 1021   CHOLHDL 3.2 01/17/2018 1607   CHOLHDL 2.8 10/24/2015 1021   LDLCALC 120 (H) 01/17/2018 1607   LDLCALC 118 10/24/2015 1021    Physical Exam:    VS:  BP (!) 154/98   Pulse (!) 110   Ht 5\' 5"  (1.651 m)   Wt 77 kg (169 lb 12.8 oz)   SpO2 95%   BMI 28.26 kg/m     Wt Readings from Last 3 Encounters:  06/16/18 77 kg (169 lb 12.8 oz)  05/27/18 77.1 kg (170 lb)  04/06/18 78.5 kg (173 lb)    Pt in NAD  HEENT  NCAT NEck:  JVP normal   Lungs:  CTA   Cardiac RRR   No S3  No murmurs    Abdomen   No hepatomegaly Ext:  2+ pulses  No edema    ASSESSMENT:    No diagnosis found. PLAN:    In order of problems listed above:  Hypertension:    On arrival to clinic, pt was very hypertensive and HR was over 100 Laying down for several minutes for orthostatics, BP improved   130s   HR also imporved to 90s    She was not orthostatic  BP is not optimal.   Add she is not tolerating diuretic due to hypokalemia I have recomm:   1  Stop chlrorathidone 2  Stop potassium 3  Increase Cozaar to 50 mg    4  Continue amlodipine   7.5 mg  Will get CBC and BMET      Plan for f/u in HTN clinic (in 2 wks   Discussed with K. Auten   Hypokalemia: It has been significant    She is on a lot of potassium because of diuretic   I would favor stopping diuretic and K  CHeck BMET today   Coronary artery calcification:  Seen on cardiac CTA with calcium score of 61.  She continues on statin and working on blood pressure control. She is very active with running.   No symptoms of angina  Hyperlipidemia: On rosuvastatin 10 mg daily.  LDL was 120 in 12/2017  Hypothyroid: On synthroid. TSH in April was 1.570    Medication Adjustments/Labs and Tests Ordered: Current medicines are reviewed at length with the patient today.  Concerns regarding medicines are outlined above. Labs and tests ordered  and  medication changes are outlined in the patient instructions below:  There are no Patient Instructions on file for this visit.   Signed, Dietrich Pates, MD  06/16/2018 8:50 AM    [facility]

## 2018-06-22 ENCOUNTER — Other Ambulatory Visit: Payer: Self-pay | Admitting: Physician Assistant

## 2018-06-28 ENCOUNTER — Other Ambulatory Visit: Payer: Self-pay | Admitting: Obstetrics and Gynecology

## 2018-06-28 DIAGNOSIS — Z1231 Encounter for screening mammogram for malignant neoplasm of breast: Secondary | ICD-10-CM

## 2018-07-15 ENCOUNTER — Ambulatory Visit
Admission: RE | Admit: 2018-07-15 | Discharge: 2018-07-15 | Disposition: A | Discharge status: Home / Self Care | Payer: PRIVATE HEALTH INSURANCE | Source: Ambulatory Visit | Attending: Obstetrics and Gynecology | Admitting: Obstetrics and Gynecology

## 2018-07-15 DIAGNOSIS — Z1231 Encounter for screening mammogram for malignant neoplasm of breast: Secondary | ICD-10-CM

## 2018-11-22 ENCOUNTER — Telehealth: Payer: Self-pay | Admitting: Internal Medicine

## 2018-11-22 NOTE — Telephone Encounter (Signed)
Pt called to report that her BP was elevated today at Dr. Raynaldo OpitzLowes office her OBGYN... it was 162/100 but the last time she checked it 2 weeks ago it was 153/90.Marland Kitchen. She is asymptomatic and would not have known it was that high...  She never came in to see the HTN clinic after her 05/2018 appt.. She is willing to come in for a visit..  I asked her to sit and relax now that she is home and I will call her back in about 20-30 minutes to see what her BP is now running..  I will also forward to Dr. Tenny Crawoss to see if she would like to see her but her next office day is not until 11/28/18 or sooner with an APP or if she would like to make any med changes.. Pt agrees..   Called pt back and her BP recheck after relaxing was 162/100 and her HR is 80. Will talk with the DOD for his recommendation for tonight to address her BP that she has now.   Pt advised that Dr. Graciela HusbandsKlein recommends that she increases her Cozaar to 100mg  a day and to get established with a PMD and will call her after reviewing with Dr.Ross. Pt to recheck her BP and if develops any symptoms such as headache, dizziness, chest pain.Marland Kitchen.to call EMS or go to the ER.

## 2018-11-22 NOTE — Telephone Encounter (Signed)
  Pt c/o BP issue: STAT if pt c/o blurred vision, one-sided weakness or slurred speech  1. What are your last 5 BP readings? 162/100 11/21/18, 153/90 2 weeks ago  2. Are you having any other symptoms (ex. Dizziness, headache, blurred vision, passed out)?  no  3. What is your BP issue? Patients blood pressure has been running a little high and she would like to know if her medication needs to be changed or does she need to be seen?

## 2018-11-23 ENCOUNTER — Emergency Department (HOSPITAL_COMMUNITY)
Admission: EM | Admit: 2018-11-23 | Discharge: 2018-11-24 | Disposition: A | Discharge status: Home / Self Care | Payer: PRIVATE HEALTH INSURANCE | Attending: Emergency Medicine | Admitting: Emergency Medicine

## 2018-11-23 ENCOUNTER — Other Ambulatory Visit: Payer: Self-pay

## 2018-11-23 ENCOUNTER — Emergency Department (HOSPITAL_COMMUNITY): Payer: PRIVATE HEALTH INSURANCE

## 2018-11-23 ENCOUNTER — Encounter (HOSPITAL_COMMUNITY): Payer: Self-pay | Admitting: Emergency Medicine

## 2018-11-23 DIAGNOSIS — R079 Chest pain, unspecified: Secondary | ICD-10-CM

## 2018-11-23 DIAGNOSIS — T148XXA Other injury of unspecified body region, initial encounter: Secondary | ICD-10-CM | POA: Insufficient documentation

## 2018-11-23 DIAGNOSIS — X58XXXA Exposure to other specified factors, initial encounter: Secondary | ICD-10-CM | POA: Diagnosis not present

## 2018-11-23 DIAGNOSIS — Y929 Unspecified place or not applicable: Secondary | ICD-10-CM | POA: Diagnosis not present

## 2018-11-23 DIAGNOSIS — Z79899 Other long term (current) drug therapy: Secondary | ICD-10-CM | POA: Insufficient documentation

## 2018-11-23 DIAGNOSIS — E039 Hypothyroidism, unspecified: Secondary | ICD-10-CM | POA: Diagnosis not present

## 2018-11-23 DIAGNOSIS — Y939 Activity, unspecified: Secondary | ICD-10-CM | POA: Insufficient documentation

## 2018-11-23 DIAGNOSIS — R0789 Other chest pain: Secondary | ICD-10-CM | POA: Diagnosis present

## 2018-11-23 DIAGNOSIS — I10 Essential (primary) hypertension: Secondary | ICD-10-CM | POA: Diagnosis not present

## 2018-11-23 DIAGNOSIS — Y999 Unspecified external cause status: Secondary | ICD-10-CM | POA: Diagnosis not present

## 2018-11-23 LAB — BASIC METABOLIC PANEL
Anion gap: 14 (ref 5–15)
BUN: 7 mg/dL (ref 6–20)
CO2: 23 mmol/L (ref 22–32)
Calcium: 9.7 mg/dL (ref 8.9–10.3)
Chloride: 101 mmol/L (ref 98–111)
Creatinine, Ser: 0.56 mg/dL (ref 0.44–1.00)
GFR calc Af Amer: >60 mL/min (ref >60)
GFR calc non Af Amer: >60 mL/min (ref >60)
Glucose, Bld: 109 mg/dL — ABNORMAL HIGH (ref 70–99)
Potassium: 3.2 mmol/L — ABNORMAL LOW (ref 3.5–5.1)
Sodium: 138 mmol/L (ref 135–145)

## 2018-11-23 LAB — CBC
HCT: 46.9 % — ABNORMAL HIGH (ref 36.0–46.0)
Hemoglobin: 15.3 g/dL — ABNORMAL HIGH (ref 12.0–15.0)
MCH: 30.9 pg (ref 26.0–34.0)
MCHC: 32.6 g/dL (ref 30.0–36.0)
MCV: 94.7 fL (ref 80.0–100.0)
Platelets: 303 10*3/uL (ref 150–400)
RBC: 4.95 MIL/uL (ref 3.87–5.11)
RDW: 12.6 % (ref 11.5–15.5)
WBC: 9 10*3/uL (ref 4.0–10.5)
nRBC: 0 % (ref 0.0–0.2)

## 2018-11-23 LAB — POCT I-STAT TROPONIN I: Troponin i, poc: 0 ng/mL (ref 0.00–0.08)

## 2018-11-23 LAB — I-STAT BETA HCG BLOOD, ED (NOT ORDERABLE): I-stat hCG, quantitative: <5 m[IU]/mL (ref <5)

## 2018-11-23 LAB — D-DIMER, QUANTITATIVE: D-Dimer, Quant: 0.41 ug/mL-FEU (ref 0.00–0.50)

## 2018-11-23 MED ORDER — SODIUM CHLORIDE 0.9 % IV BOLUS: 1000.0000 mL | Freq: Once | INTRAVENOUS | Status: DC

## 2018-11-23 NOTE — Telephone Encounter (Signed)
Pt has been scheduled for HTN clinic, 11/25/18 @ 9:30.  Pt has been advised to keep a log of her bp and bring with her since increased Cozaar to 100 mg qd. Pt verbalized understanding.

## 2018-11-23 NOTE — Telephone Encounter (Signed)
She should get an appt in HTN clinic   They can work more efficiently and changing meds as needed.

## 2018-11-23 NOTE — ED Triage Notes (Signed)
Patient c/o constant dull ache to left side of chest radiating to back x1 hour. Denies N/V, SOB. Dx with viral bronchitis x2 weeks ago. Hx tachycardia and hypertension.

## 2018-11-23 NOTE — ED Provider Notes (Signed)
[facility]-EMERGENCY DEPT Provider Note   CSN: [id number] Arrival date & time: 11/23/18  2121     History   Chief Complaint Chief Complaint  Patient presents with  . Chest Pain    HPI Jodi Reynolds is a 42 y.o. female possible history of coronary artery calcification, hyperlipidemia, hypertension, thyroid disease who presents for evaluation of left-sided chest pain that began at about 7:30 PM this evening while she was on a wine tasting.  She states that pain is located on the left and radiates around to her back.  She describes it as a pressure sensation.  She states that she did not get any associated shortness of breath, diaphoresis, nausea/vomiting.  She states that it the pain is worse with deep inspiration but is not worse with exertion.  She also reports that the pain is worse when she lays back and when she moves around.  She notices that when she twists her torso, thinks the pain worse.  Pain is improved by sitting up.  She has not taken any medication for the pain and states that her pain has not had any changes.  Patient does report that she has been sick over the last 2 weeks with viral bronchitis and has been coughing a lot.  She states most of her symptoms have improved but she continues to cough.  No hemoptysis noted.  Patient reports she is not a current smoker. She denies any OCP use, recent immobilization, prior history of DVT/PE, recent surgery, leg swelling, or long travel.  She does report that she has a history of tachycardia.  She reports they did not know what was causing tachycardia but she is followed by Dr. Dietrich Pates (cardiology) patient denies any other personal cardiac history.  Denies any family cardiac history before the age 45.  Patient denies any fevers, abdominal pain, nausea/vomiting, numbness/weakness of her arms or legs.   The history is provided by the patient.    Past Medical History:  Diagnosis Date  . Abnormal Pap smear   .  Coronary artery calcification seen on CT scan    in 2016  . Hyperlipidemia   . Hypertension   . Thyroid disease   . Urinary tract infection     Patient Active Problem List   Diagnosis Date Noted  . Hypokalemia due to loss of potassium 05/27/2018  . Essential hypertension 04/06/2018  . Coronary artery calcification seen on CAT scan 06/10/2017  . Hyperlipidemia 06/10/2017  . Abnormality of gait 03/21/2015  . Hypothyroidism 03/12/2015  . Stress fracture of femoral neck 03/12/2015    History reviewed. No pertinent surgical history.   OB History   None      Home Medications    Prior to Admission medications   Medication Sig Start Date End Date Taking? Authorizing Provider  amLODipine (NORVASC) 5 MG tablet TAKE 1 AND 1/2 TABLETS (7.5 MG TOTAL) BY MOUTH ONCE DAILY Patient taking differently: Take 7.5 mg by mouth daily.  06/22/18  Yes Pricilla Riffle, MD  dextromethorphan-guaiFENesin [facility] DM) 30-600 MG 12hr tablet Take 1 tablet by mouth 2 (two) times daily.   Yes [provider]  losartan (COZAAR) 50 MG tablet Take 1 tablet (50 mg total) by mouth daily. Patient taking differently: Take 100 mg by mouth daily.  06/16/18  Yes Pricilla Riffle, MD  Melatonin 10 MG TABS Take by mouth.   Yes [provider]  naproxen sodium (ALEVE) 220 MG tablet Take 220 mg by mouth 2 (  two) times daily as needed (pain).   Yes [provider]  SYNTHROID 88 MCG tablet Take 88 mcg by mouth daily. 06/02/17  Yes [provider]  methocarbamol (ROBAXIN) 500 MG tablet Take 1 tablet (500 mg total) by mouth 2 (two) times daily. 11/24/18   Maxwell Caul, PA-C  rosuvastatin (CRESTOR) 10 MG tablet Take 1 tablet (10 mg total) by mouth daily. Patient not taking: Reported on 11/23/2018 04/06/18 07/05/18  Beatrice Lecher, PA-C    Family History Family History  Problem Relation Age of Onset  . Heart disease Other   . Thyroid disease Other   . Gallbladder disease Other   . Arthritis  Other   . Hypertension Other   . Cancer Other        Ovarian  . Heart disease Mother   . Hypertension Mother   . Healthy Sister     Social History Social History   Tobacco Use  . Smoking status: Never Smoker  . Smokeless tobacco: Never Used  Substance Use Topics  . Alcohol use: Yes    Alcohol/week: 0.0 standard drinks    Comment: 2/3 drinks per day.  . Drug use: No     Allergies   Lipitor [atorvastatin]; Lobster [shellfish allergy]; and Other   Review of Systems Review of Systems  Constitutional: Negative for fever.  Eyes: Negative for visual disturbance.  Respiratory: Positive for cough. Negative for shortness of breath.   Cardiovascular: Positive for chest pain.  Gastrointestinal: Negative for abdominal pain, nausea and vomiting.  Genitourinary: Negative for dysuria and hematuria.  Neurological: Negative for weakness, numbness and headaches.  All other systems reviewed and are negative.    Physical Exam Updated Vital Signs BP (!) 147/94 (BP Location: Right Arm)   Pulse (!) 107   Temp 97.7 F (36.5 C) (Oral)   Resp 17   LMP 11/08/2018   SpO2 98%   Physical Exam  Constitutional: She is oriented to person, place, and time. She appears well-developed and well-nourished.  Sitting comfortably on examination table  HENT:  Head: Normocephalic and atraumatic.  Mouth/Throat: Oropharynx is clear and moist and mucous membranes are normal.  Eyes: Pupils are equal, round, and reactive to light. Conjunctivae, EOM and lids are normal.  Neck: Full passive range of motion without pain.  Cardiovascular: Regular rhythm, normal heart sounds and normal pulses. Tachycardia present. Exam reveals no gallop and no friction rub.  No murmur heard. Pulses:      Radial pulses are 2+ on the right side, and 2+ on the left side.       Dorsalis pedis pulses are 2+ on the right side, and 2+ on the left side.  Pulmonary/Chest: Effort normal and breath sounds normal.  Lungs clear to  auscultation bilaterally.  Symmetric chest rise.  No wheezing, rales, rhonchi.  Pain reproduced with palpation of the left anterior chest wall.  No deformity or crepitus noted.  Abdominal: Soft. Normal appearance. There is no tenderness. There is no rigidity and no guarding.  Musculoskeletal: Normal range of motion.  Neurological: She is alert and oriented to person, place, and time.  Cranial nerves III-XII intact Follows commands, Moves all extremities  5/5 strength to BUE and BLE  Sensation intact throughout all major nerve distributions Normal coordination No pronator drift. No gait abnormalities  No slurred speech. No facial droop.   Skin: Skin is warm and dry. Capillary refill takes less than 2 seconds.  Psychiatric: She has a normal mood and affect. Her  speech is normal.  Nursing note and vitals reviewed.    ED Treatments / Results  Labs (all labs ordered are listed, but only abnormal results are displayed) Labs Reviewed  BASIC METABOLIC PANEL - Abnormal; Notable for the following components:      Result Value   Potassium 3.2 (*)    Glucose, Bld 109 (*)    All other components within normal limits  CBC - Abnormal; Notable for the following components:   Hemoglobin 15.3 (*)    HCT 46.9 (*)    All other components within normal limits  D-DIMER, QUANTITATIVE (NOT AT [facility])  I-STAT TROPONIN, ED  I-STAT BETA HCG BLOOD, ED ([facility], [facility], [facility] ONLY)  I-STAT BETA HCG BLOOD, ED (NOT ORDERABLE)  POCT I-STAT TROPONIN I  I-STAT TROPONIN, ED  POCT I-STAT TROPONIN I    EKG EKG Interpretation  Date/Time:  Wednesday November 23 2018 21:29:34 EST Ventricular Rate:  128 PR Interval:    QRS Duration: 87 QT Interval:  312 QTC Calculation: 456 R Axis:   6 Text Interpretation:  Sinus tachycardia Otherwise no significant change Confirmed by Drema Pry [phone]) on 11/24/2018 12:44:34 AM   Radiology Dg Chest 2 View  Result Date: 11/23/2018 CLINICAL DATA:  Left-sided chest pain. EXAM:  CHEST - 2 VIEW COMPARISON:  May 06, 2005 FINDINGS: The heart size and mediastinal contours are within normal limits. Both lungs are clear. The visualized skeletal structures are unremarkable. IMPRESSION: No active cardiopulmonary disease. Electronically Signed   By: Gerome Sam III M.D   On: 11/23/2018 21:50    Procedures Procedures (including critical care time)  Medications Ordered in ED Medications - No data to display   Initial Impression / Assessment and Plan / ED Course  I have reviewed the triage vital signs and the nursing notes.  Pertinent labs & imaging results that were available during my care of the patient were reviewed by me and considered in my medical decision making (see chart for details).     42 year old female who presents for evaluation of chest pain that began this evening.  She describes as a pressure that radiates around on her left side and to the back.  It is worse with deep inspiration.  Not worse with exertion.  No associated shortness of breath.  No numbness/weakness, vision changes. Patient is afebrile, non-toxic appearing, sitting comfortably on examination table. Vital signs reviewed.  Patient is hypertensive and tachycardic.  Vitals otherwise stable.  Patient does report she has a history of tachycardia and is seen by Dr. Tenny Craw (cardiology) for her tachycardia.  Additionally, patient reports that she has a history of high blood pressure and states that her blood pressure recently has been elevated.  She recently had her Cozaar increased to help with her blood pressure management and is scheduled to see the blood pressure clinic.  She states that her heart rate normally is elevated.  Low suspicion for ACS etiology given presentation but a consideration.  Additionally, consider musculoskeletal pain versus costochondritis versus pericarditis.  Given her recent acute bronchitis, costochondritis or musculoskeletal pain could be source of her pain.  Low suspicion for  PE but given tachycardia and pleuritic chest pain, will obtain d-dimer.  History/physical exam is not concerning for aortic dissection, hypertensive emergency, pancreatitis.  I offered patient analgesics but she declined at this time.    Review of patient's records show that she has previously been tachycardic and other appointments and ED visits.  It looks like she has a history of  tachycardia.  Additionally, patient has been hypertensive previously.  She was seen at the OB/GYN a few days ago and with her blood pressure was noted be 160/100.  Her primary care doctor decided to increase her blood pressure medication but she states she just started earlier today.   I-STAT troponin negative.  I-STAT beta negative.  BMP shows potassium of 3.2.  CBC shows no leukocytosis.  Hemoglobin is 15.3.  Chest x-ray shows no acute infectious etiology.  No other acute abnormalities.  EKG shows sinus tach 128.   Discussed results with patient.  Patient still declining any pain medication.  I discussed with patient that I would like to check a delta troponin given her tachycardia and given the fact that the chest pains are started approximately 9 PM.  Additionally, discussed with patient regarding IV and fluids output tachycardia but patient declined wanting any other further work-up.  She is agreeable to having her delta troponin drawn.   Delta trop negative.  Discussed results with patient.  Vitals show hypertension has improved.  Is still slightly elevated this appears to be consistent with her baseline.  Additionally, her tachycardia has improved but still present.  Her most recent vitals show heart rate in the 100s.  Patient states she feels comfortable going home.  At this time, do not suspect ACS etiology, aortic dissection as a source of patient's pain.  Question musculoskeletal versus costochondritis given recent viral illness.  We will plan to have patient follow-up with her outpatient cardiologist. Discussed  patient with Dr. Eudelia Bunchardama who is agreeable to plan. .At this time, patient exhibits no emergent life-threatening condition that require further evaluation in ED. Patient had ample opportunity for questions and discussion. All patient's questions were answered with full understanding. Strict return precautions discussed. Patient expresses understanding and agreement to plan.   Final Clinical Impressions(s) / ED Diagnoses   Final diagnoses:  Chest pain, unspecified type  Muscle strain    ED Discharge Orders         Ordered    methocarbamol (ROBAXIN) 500 MG tablet  2 times daily     11/24/18 0127           Rosana HoesLayden, Lindsey A, PA-C 11/24/18 0622    Nira Connardama, Pedro Eduardo, MD 11/24/18 [id number]

## 2018-11-24 LAB — POCT I-STAT TROPONIN I: TROPONIN I, POC: 0 ng/mL (ref 0.00–0.08)

## 2018-11-24 MED ORDER — METHOCARBAMOL 500 MG PO TABS: 500.0000 mg | ORAL_TABLET | Freq: Two times a day (BID) | ORAL | 0 refills | Status: DC

## 2018-11-24 NOTE — ED Notes (Signed)
Pt. Refused for IV insertion and IVF as ordered  , pt. Requested to see and talk to Reynolds prior to receiving fluid. Pt. Stated that her HR has always been high and that 120's bpm is not unusual to her . This Nurse educated the pt. The importance of  IVF and repeat Trop. Pt. Agreed to be stuck by butterfly only BUT NOT for IV access. Jodi AbedLindsay Reynolds notified. Pt. Is made aware that Reynolds is seeing 3 more pt.'s before her.

## 2018-11-24 NOTE — Discharge Instructions (Addendum)
You can take Tylenol or Ibuprofen as directed for pain. You can alternate Tylenol and Ibuprofen every 4 hours. If you take Tylenol at 1pm, then you can take Ibuprofen at 5pm. Then you can take Tylenol again at 9pm.   Take your blood pressure medications as directed.  Follow-up with Dr. Tenny Crawoss.  Call her office and arrange an appointment.  Return to the Emergency Department immediately if you experiencing worsening chest pain, difficulty breathing, nausea/vomiting, get very sweaty, headache or any other worsening or concerning symptoms.

## 2018-11-25 ENCOUNTER — Ambulatory Visit (INDEPENDENT_AMBULATORY_CARE_PROVIDER_SITE_OTHER): Payer: PRIVATE HEALTH INSURANCE | Admitting: Pharmacist

## 2018-11-25 VITALS — BP 138/92 | HR 103

## 2018-11-25 DIAGNOSIS — I10 Essential (primary) hypertension: Secondary | ICD-10-CM

## 2018-11-25 NOTE — Patient Instructions (Signed)
Target blood pressure is less than 130/80  Return for a follow up appointment in 2-3 weeks  Go to the lab with follow up visit.   Check your blood pressure at home daily (if able) and keep record of the readings.  Take your BP meds as follows: CONTINUE losartan 100mg  daily and amlodipine 7.5mg  daily  Bring all of your meds, your BP cuff and your record of home blood pressures to your next appointment.  Exercise as you're able, try to walk approximately 30 minutes per day.  Keep salt intake to a minimum, especially watch canned and prepared boxed foods.  Eat more fresh fruits and vegetables and fewer canned items.  Avoid eating in fast food restaurants.    HOW TO TAKE YOUR BLOOD PRESSURE: . Rest 5 minutes before taking your blood pressure. .  Don't smoke or drink caffeinated beverages for at least 30 minutes before. . Take your blood pressure before (not after) you eat. . Sit comfortably with your back supported and both feet on the floor (don't cross your legs). . Elevate your arm to heart level on a table or a desk. . Use the proper sized cuff. It should fit smoothly and snugly around your bare upper arm. There should be enough room to slip a fingertip under the cuff. The bottom edge of the cuff should be 1 inch above the crease of the elbow. . Ideally, take 3 measurements at one sitting and record the average.

## 2018-11-25 NOTE — Progress Notes (Signed)
Patient ID: Jodi Reynolds                 DOB: [birth year]                      MRN: [id number]     HPI: Jodi Reynolds is a 42 y.o. female patient of Dr. Tenny Crawoss who presents today for hypertension evaluation. PMH significant for coronary artery calcification with calcium score of 61, hypertension, hyperlipidemia. At her most recent visit with Dr. Tenny Crawoss her diuretic was stopped due to hypokalemia and her Cozaar was increased. Since this time she has been seen in the ED for chest pain that was felt to be musculoskeletal versus costochondritis given recent viral illness. BMET at this visit showed stable kidney function with increased dose of Cozaar and potassium that remains low.   She presents today for further blood pressure management. She has been diagnosed with pseudogout. She states that her chest pain has improved and seems to be more similar to previous costochondritis now. She states she believes her pressures have still be high over the last several days and she is concerned about this.   Current HTN meds:  Amlodipine 7.5mg  daily lunch time Losartan 100mg  daily lunch time  Previously tried: chlorthalidone (hypokalemia), higher doses of amlodipine (LLE)  BP goal: <130/80  Family History: Arthritis in her other; Cancer in her other; Gallbladder disease in her other; Healthy in her sister; Heart disease in her mother and other; Hypertension in her mother and other; Thyroid disease in her other.  Social History: never smoker, 2-3 drinks per day  Diet: She has been drinking green juices in the morning. She eats lunch and dinner. She eats all kinds of meats. She does use salt and pepper. She avoids processed foods. She drinks mostly water. She has one cup of coffee per day. She drinks wine 2-4 glasses per day.   Exercise: She used to run. She was injured in July and she walks about 40 minutes per day.   Home BP readings: Arm - She has not been checking regularly.   Wt Readings from Last 3  Encounters:  06/16/18 169 lb 12.8 oz (77 kg)  05/27/18 170 lb (77.1 kg)  04/06/18 173 lb (78.5 kg)   BP Readings from Last 3 Encounters:  11/24/18 (!) 147/94  06/16/18 (!) 154/98  05/27/18 (!) 152/90   Pulse Readings from Last 3 Encounters:  11/24/18 (!) 107  06/16/18 (!) 110  05/27/18 (!) 108    Renal function: CrCl cannot be calculated (Unknown ideal weight.).  Past Medical History:  Diagnosis Date  . Abnormal Pap smear   . Coronary artery calcification seen on CT scan    in 2016  . Hyperlipidemia   . Hypertension   . Thyroid disease   . Urinary tract infection     Current Outpatient Medications on File Prior to Visit  Medication Sig Dispense Refill  . amLODipine (NORVASC) 5 MG tablet TAKE 1 AND 1/2 TABLETS (7.5 MG TOTAL) BY MOUTH ONCE DAILY (Patient taking differently: Take 7.5 mg by mouth daily. ) 135 tablet 3  . dextromethorphan-guaiFENesin (MUCINEX DM) 30-600 MG 12hr tablet Take 1 tablet by mouth 2 (two) times daily.    Marland Kitchen. losartan (COZAAR) 50 MG tablet Take 1 tablet (50 mg total) by mouth daily. (Patient taking differently: Take 100 mg by mouth daily. ) 90 tablet 3  . Melatonin 10 MG TABS Take by mouth.    . methocarbamol (ROBAXIN) 500 MG  tablet Take 1 tablet (500 mg total) by mouth 2 (two) times daily. 20 tablet 0  . naproxen sodium (ALEVE) 220 MG tablet Take 220 mg by mouth 2 (two) times daily as needed (pain).    . rosuvastatin (CRESTOR) 10 MG tablet Take 1 tablet (10 mg total) by mouth daily. (Patient not taking: Reported on 11/23/2018) 90 tablet 3  . SYNTHROID 88 MCG tablet Take 88 mcg by mouth daily.     No current facility-administered medications on file prior to visit.     Allergies  Allergen Reactions  . Lipitor [Atorvastatin]     Headache and ankle swelling  . Lobster [Shellfish Allergy]     (LOBSTER AND CRAB )  CAUSES PATIENT TO BREAK UP  . Other     SAFFRON SPICES: CAUSE HER THROAT TO CLOSE UP    Last menstrual period  11/08/2018.   Assessment/Plan: Hypertension: BP is above goal today. Discussed change to ACEi for more potent blood pressure effect (she wishes to avoid other ARBs due to recalls), but will defer for now since still experiencing cough. We also discussed beta blocker therapy given high heart rate, but she is somewhat concerned about energy level. Follow up in 2-3 weeks for consideration of initiation of above therapy. Will check BMET at visit as well.    Thank you, Freddie Apley. Cleatis Polka, PharmD  [facility]  11/25/2018 7:59 AM

## 2018-12-12 ENCOUNTER — Ambulatory Visit (INDEPENDENT_AMBULATORY_CARE_PROVIDER_SITE_OTHER): Payer: PRIVATE HEALTH INSURANCE | Admitting: Pharmacist

## 2018-12-12 VITALS — BP 144/92 | HR 104

## 2018-12-12 DIAGNOSIS — I10 Essential (primary) hypertension: Secondary | ICD-10-CM

## 2018-12-12 LAB — BASIC METABOLIC PANEL
BUN/Creatinine Ratio: 16 (ref 9–23)
BUN: 8 mg/dL (ref 6–24)
CO2: 21 mmol/L (ref 20–29)
Calcium: 9.4 mg/dL (ref 8.7–10.2)
Chloride: 97 mmol/L (ref 96–106)
Creatinine, Ser: 0.49 mg/dL — ABNORMAL LOW (ref 0.57–1.00)
GFR calc Af Amer: 138 mL/min/{1.73_m2} (ref >59)
GFR calc non Af Amer: 120 mL/min/{1.73_m2} (ref >59)
Glucose: 77 mg/dL (ref 65–99)
Potassium: 4.2 mmol/L (ref 3.5–5.2)
Sodium: 137 mmol/L (ref 134–144)

## 2018-12-12 MED ORDER — LISINOPRIL 40 MG PO TABS: 40.0000 mg | ORAL_TABLET | Freq: Every day | ORAL | 3 refills | Status: DC

## 2018-12-12 NOTE — Patient Instructions (Signed)
Return for a follow up appointment in 4-6 weeks  Go to the lab today  Your blood pressure goal is less than 130/80  Check your blood pressure at home daily (if able) and keep record of the readings.  Take your BP meds as follows: CONTINUE amlodipine 7.5mg  once daily   STOP losartan   START lisinopril 40mg  once daily   Bring all of your meds, your BP cuff and your record of home blood pressures to your next appointment.  Exercise as you're able, try to walk approximately 30 minutes per day.  Keep salt intake to a minimum, especially watch canned and prepared boxed foods.  Eat more fresh fruits and vegetables and fewer canned items.  Avoid eating in fast food restaurants.    HOW TO TAKE YOUR BLOOD PRESSURE: . Rest 5 minutes before taking your blood pressure. .  Don't smoke or drink caffeinated beverages for at least 30 minutes before. . Take your blood pressure before (not after) you eat. . Sit comfortably with your back supported and both feet on the floor (don't cross your legs). . Elevate your arm to heart level on a table or a desk. . Use the proper sized cuff. It should fit smoothly and snugly around your bare upper arm. There should be enough room to slip a fingertip under the cuff. The bottom edge of the cuff should be 1 inch above the crease of the elbow. . Ideally, take 3 measurements at one sitting and record the average.

## 2018-12-12 NOTE — Progress Notes (Signed)
Patient ID: Jodi AlamoJulia Vanness                 DOB: [birth year]                      MRN: [id number]     HPI: Jodi Reynolds is a 42 y.o. female patient of Dr. Tenny Crawoss who presents today for hypertension follow up. PMH significant for coronary artery calcification with calcium score of 61, hypertension, hyperlipidemia. At her most recent visit with Dr. Tenny Crawoss her diuretic was stopped due to hypokalemia and her Cozaar was increased. Since this time she has been seen in the ED for chest pain that was felt to be musculoskeletal versus costochondritis given recent viral illness. BMET at this visit showed stable kidney function with increased dose of Cozaar and potassium that remains low.   She presents today for further blood pressure follow up. She has been diagnosed with pseudogout. She states that her chest pain has improved and seems to be more similar to previous costochondritis now. She states that chest pain has improved since last visit.   Current HTN meds:  Amlodipine 7.5mg  daily lunch time Losartan 100mg  daily lunch time  Previously tried: chlorthalidone (hypokalemia), higher doses of amlodipine (LLE)  BP goal: <130/80  Family History: Arthritis in her other; Cancer in her other; Gallbladder disease in her other; Healthy in her sister; Heart disease in her mother and other; Hypertension in her mother and other; Thyroid disease in her other.  Social History: never smoker, 2-3 drinks per day  Diet: She has been drinking green juices in the morning. She eats lunch and dinner. She eats all kinds of meats. She does use salt and pepper. She avoids processed foods. She drinks mostly water. She has one cup of coffee per day. She drinks wine 2-4 glasses per day.   Exercise: She used to run. She was injured in July and she walks about 40 minutes per day.   Home BP readings: Arm - She has not been checking regularly.   Wt Readings from Last 3 Encounters:  06/16/18 169 lb 12.8 oz (77 kg)  05/27/18 170 lb (77.1  kg)  04/06/18 173 lb (78.5 kg)   BP Readings from Last 3 Encounters:  12/12/18 (!) 144/92  11/25/18 (!) 138/92  11/24/18 (!) 147/94   Pulse Readings from Last 3 Encounters:  12/12/18 (!) 104  11/25/18 (!) 103  11/24/18 (!) 107    Renal function: CrCl cannot be calculated (Unknown ideal weight.).  Past Medical History:  Diagnosis Date  . Abnormal Pap smear   . Coronary artery calcification seen on CT scan    in 2016  . Hyperlipidemia   . Hypertension   . Thyroid disease   . Urinary tract infection     Current Outpatient Medications on File Prior to Visit  Medication Sig Dispense Refill  . amLODipine (NORVASC) 5 MG tablet TAKE 1 AND 1/2 TABLETS (7.5 MG TOTAL) BY MOUTH ONCE DAILY (Patient taking differently: Take 7.5 mg by mouth daily. ) 135 tablet 3  . dextromethorphan-guaiFENesin (MUCINEX DM) 30-600 MG 12hr tablet Take 1 tablet by mouth 2 (two) times daily.    Marland Kitchen. ibuprofen (ADVIL,MOTRIN) 200 MG tablet Take 400 mg by mouth every 6 (six) hours as needed.    . Melatonin 10 MG TABS Take by mouth.    . methocarbamol (ROBAXIN) 500 MG tablet Take 1 tablet (500 mg total) by mouth 2 (two) times daily. (Patient not taking: Reported on  11/25/2018) 20 tablet 0  . naproxen sodium (ALEVE) 220 MG tablet Take 220 mg by mouth 2 (two) times daily as needed (pain).    . rosuvastatin (CRESTOR) 10 MG tablet Take 1 tablet (10 mg total) by mouth daily. (Patient not taking: Reported on 11/23/2018) 90 tablet 3  . SYNTHROID 88 MCG tablet Take 88 mcg by mouth daily.     No current facility-administered medications on file prior to visit.     Allergies  Allergen Reactions  . Lipitor [Atorvastatin]     Headache and ankle swelling  . Lobster [Shellfish Allergy]     (LOBSTER AND CRAB )  CAUSES PATIENT TO BREAK UP  . Other     SAFFRON SPICES: CAUSE HER THROAT TO CLOSE UP    Blood pressure (!) 144/92, pulse (!) 104.   Assessment/Plan: Hypertension: BP is above goal today. Discussed options  including changing losartan to lisinopril, or intiation of betablocker vs. Spironolactone. Will check BMET today to check on potassium. She would prefer to change to lisinopril first, before starting another medication. Will stop losartan and start lisinopril 40mg  once daily. Advised to monitor pressures and bring log. Follow up in HTN clinic in 4 weeks.   Thank you, Freddie ApleyKelley M. Cleatis PolkaAuten, PharmD  [facility]  12/12/2018 9:35 AM

## 2019-01-09 ENCOUNTER — Ambulatory Visit (INDEPENDENT_AMBULATORY_CARE_PROVIDER_SITE_OTHER): Payer: PRIVATE HEALTH INSURANCE | Admitting: Pharmacist

## 2019-01-09 VITALS — BP 148/92 | HR 110

## 2019-01-09 DIAGNOSIS — E7849 Other hyperlipidemia: Secondary | ICD-10-CM

## 2019-01-09 DIAGNOSIS — I2510 Atherosclerotic heart disease of native coronary artery without angina pectoris: Secondary | ICD-10-CM | POA: Diagnosis not present

## 2019-01-09 DIAGNOSIS — I10 Essential (primary) hypertension: Secondary | ICD-10-CM

## 2019-01-09 MED ORDER — SPIRONOLACTONE 25 MG PO TABS: 25.0000 mg | ORAL_TABLET | Freq: Every day | ORAL | 11 refills | Status: DC

## 2019-01-09 MED ORDER — LISINOPRIL 40 MG PO TABS: 40.0000 mg | ORAL_TABLET | Freq: Every day | ORAL | 3 refills | Status: DC

## 2019-01-09 NOTE — Progress Notes (Signed)
Patient ID: Jodi AlamoJulia Sargeant                 DOB: May 13, 1976                      MRN: [id number]     HPI: Jodi Reynolds is a 43 y.o. female patient of Dr. Tenny Crawoss who presents today for hypertension follow up. PMH significant for coronary artery calcification with calcium score of 61, hypertension, hyperlipidemia. Patient was seen in the ED in December for chest pain that was felt to be musculoskeletal versus costochondritis given recent viral illness. BMET at this visit showed stable kidney function with increased dose of Cozaar and potassium that remains low. At last visit in hypertension clinic 1 month ago, blood pressure was elevated at 144/92 and losartan was stopped and switched to lisinopril 40 mg.  She presents today for further blood pressure follow up. No complaints after switching over to lisinopril. States that she has had a mild cough since November (prior to starting lisinopril) but this has not been bothersome. She says that this has not been worse and she usually has a cough this time of year. Denies headaches, blurred vision, dizziness/lightheadedness, or edema. She states she has not been checking her blood pressure at home. She states her NSAID use is minimal - ibuprofen and naproxen prescribed for costochondritis, but she she has only taken these one day after they were prescribed for inflammation.   She states she has not been taking her rosuvastatin. She has previously tried simvastatin 5mg  daily and atorvastatin 10mg  and 20mg  daily and was switched to rosuvastatin 1 year ago. Aggressive LDL goal < 70 due to family hx of ASCVD and calcium score of 61 (95th percentile) when she was 39.  Current HTN meds:  Amlodipine 7.5 mg daily - lunch time Lisinopril 40 mg daily - lunch time  Previously tried: chlorthalidone (hypokalemia), higher doses of amlodipine (LLE), losartan  BP goal: <130/80  Family History: Arthritis in her other; Cancer in her other; Gallbladder disease in her other; Healthy  in her sister; Heart disease in her mother and other; Hypertension in her mother and other; Thyroid disease in her other.  Social History: never smoker, 2-3 drinks per day  Diet: She has been drinking green juices in the morning. She eats lunch and dinner. She eats all kinds of meats. She does use salt and pepper. She avoids processed foods. She drinks mostly water. She has one cup of coffee per day. She drinks wine 2-4 glasses per day.   Exercise: She used to run. She was injured in July and she walks about 40 minutes per day.   Home BP readings: Arm - She has not been checking regularly.   Wt Readings from Last 3 Encounters:  06/16/18 169 lb 12.8 oz (77 kg)  05/27/18 170 lb (77.1 kg)  04/06/18 173 lb (78.5 kg)   BP Readings from Last 3 Encounters:  12/12/18 (!) 144/92  11/25/18 (!) 138/92  11/24/18 (!) 147/94   Pulse Readings from Last 3 Encounters:  12/12/18 (!) 104  11/25/18 (!) 103  11/24/18 (!) 107    Renal function: CrCl cannot be calculated (Patient's most recent lab result is older than the maximum 21 days allowed.).  Past Medical History:  Diagnosis Date  . Abnormal Pap smear   . Coronary artery calcification seen on CT scan    in 2016  . Hyperlipidemia   . Hypertension   . Thyroid disease   .  Urinary tract infection     Current Outpatient Medications on File Prior to Visit  Medication Sig Dispense Refill  . amLODipine (NORVASC) 5 MG tablet TAKE 1 AND 1/2 TABLETS (7.5 MG TOTAL) BY MOUTH ONCE DAILY (Patient taking differently: Take 7.5 mg by mouth daily. ) 135 tablet 3  . dextromethorphan-guaiFENesin (MUCINEX DM) 30-600 MG 12hr tablet Take 1 tablet by mouth 2 (two) times daily.    Marland Kitchen ibuprofen (ADVIL,MOTRIN) 200 MG tablet Take 400 mg by mouth every 6 (six) hours as needed.    Marland Kitchen lisinopril (PRINIVIL,ZESTRIL) 40 MG tablet Take 1 tablet (40 mg total) by mouth daily. 30 tablet 3  . Melatonin 10 MG TABS Take by mouth.    . methocarbamol (ROBAXIN) 500 MG tablet Take 1  tablet (500 mg total) by mouth 2 (two) times daily. (Patient not taking: Reported on 11/25/2018) 20 tablet 0  . naproxen sodium (ALEVE) 220 MG tablet Take 220 mg by mouth 2 (two) times daily as needed (pain).    . rosuvastatin (CRESTOR) 10 MG tablet Take 1 tablet (10 mg total) by mouth daily. (Patient not taking: Reported on 11/23/2018) 90 tablet 3  . SYNTHROID 88 MCG tablet Take 88 mcg by mouth daily.     No current facility-administered medications on file prior to visit.     Allergies  Allergen Reactions  . Lipitor [Atorvastatin]     Headache and ankle swelling  . Lobster [Shellfish Allergy]     (LOBSTER AND CRAB )  CAUSES PATIENT TO BREAK UP  . Other     SAFFRON SPICES: CAUSE HER THROAT TO CLOSE UP    There were no vitals taken for this visit.   Assessment/Plan:  1. Hypertension: BP is above goal <130/56mmHg today. Rediscussed options with starting a beta blocker or spironolactone. Would avoid increasing amlodipine since she experienced edema with 10 mg. She prefers to start spironolactone at this time. Will start spironolactone 25mg  daily and continue amlodipine 7.5mg  daily and lisinopril 40mg  daily. Advised to monitor pressures and bring log to next hypertension appointment. Follow up in HTN clinic in 3 weeks for BP check and BMET. Removed naproxen and ibuprofen from medication list as she has not been taking these regularly.  2. Hyperlipidemia: Aggressive LDL goal < 70 due to family history of ASCVD and elevated coronary calcium score. LDL last year was 120, pt did not start taking rosuvastatin as instructed. Encouraged pt to begin rosuvastatin 10mg  daily as previously instructed to lower ASCVD risk. Will repeat lipid panel 2-3 months after starting.   Danae Orleans, PharmD PGY1 Pharmacy Resident [facility] 01/09/2019       3:56 PM

## 2019-01-09 NOTE — Patient Instructions (Addendum)
It was nice seeing you today!  Please start taking spironolactone 1 tablet (25 mg) by mouth daily.  Please start taking your rosuvastatin (Crestor) 10 mg by mouth daily. We will recheck your cholesterol a few months after starting this.   Continue taking lisinopril 1 tablet (40 mg) daily and amlodipine 1.5 tablets (7.5 mg) by mouth daily.  Monitor your blood pressures at home and please bring your log to your next appointment.  Your next hypertension visit will be on 2/7. We will also check your lab work during this visit.

## 2019-01-27 ENCOUNTER — Ambulatory Visit: Payer: PRIVATE HEALTH INSURANCE

## 2019-02-01 ENCOUNTER — Ambulatory Visit: Payer: PRIVATE HEALTH INSURANCE | Admitting: Pharmacist

## 2019-02-01 VITALS — BP 130/80 | HR 103

## 2019-02-01 DIAGNOSIS — I10 Essential (primary) hypertension: Secondary | ICD-10-CM

## 2019-02-01 DIAGNOSIS — E7849 Other hyperlipidemia: Secondary | ICD-10-CM | POA: Diagnosis not present

## 2019-02-01 MED ORDER — ROSUVASTATIN CALCIUM 10 MG PO TABS: 10.0000 mg | ORAL_TABLET | Freq: Every day | ORAL | 11 refills | Status: DC

## 2019-02-01 NOTE — Patient Instructions (Signed)
Continue spironolactone 25mg  daily, amlodipine 7.5mg  daily and lisinopril 40mg  daily.  Start checking your blood pressure at home.  Call us at [phone] if your readings are averaging > 130/80

## 2019-02-01 NOTE — Progress Notes (Signed)
Patient ID: Jodi AlamoJulia Reynolds                 DOB: 10-06-1976                      MRN: [id number]     HPI: Jodi AlamoJulia Reynolds is a 43 y.o. female patient of Dr. Tenny Crawoss who presents today for hypertension follow up. PMH significant for coronary artery calcification with calcium score of 61, hypertension, hyperlipidemia. At last visit in hypertension clinic 3 weeks ago, blood pressure was elevated at 148/92. Spironolactone 25mg  daily was started at that time.   She presents today for further blood pressure follow up. She states that she has been feeling good. Denies headaches, blurred vision, dizziness/lightheadedness, or edema. States she has not been checking her blood pressure at home because she has been away, but saw a Dr a few or so ago and her blood pressure was at goal ( 118/76).   She states she has not been taking her rosuvastatin.States it was not at pharmacy when she picked up her spironolactone. She has previously tried simvastatin 5mg  daily and atorvastatin 10mg  and 20mg  daily and was switched to rosuvastatin 1 year ago, but never started. Aggressive LDL goal < 70 due to family hx of ASCVD and calcium score of 61 (95th percentile) when she was 39.  Current HTN meds:  Amlodipine 7.5 mg daily - lunch time Lisinopril 40 mg daily - lunch time Spironolactone 25mg  daily  Previously tried: chlorthalidone (hypokalemia), higher doses of amlodipine (LLE), losartan  BP goal: <130/80  Family History: Arthritis in her other; Cancer in her other; Gallbladder disease in her other; Healthy in her sister; Heart disease in her mother and other; Hypertension in her mother and other; Thyroid disease in her other.  Social History: never smoker, 2-3 drinks per day  Diet: She has been drinking green juices in the morning. She eats lunch and dinner. She eats all kinds of meats. She does use salt and pepper. She avoids processed foods. She drinks mostly water. She has one cup of coffee per day. She drinks wine 2-4  glasses per day.   Exercise: She used to run. She was injured in July and she walks about 40 minutes per day.   Home BP readings: Arm - She has not been checking regularly. Has an OMRON meter  Wt Readings from Last 3 Encounters:  06/16/18 169 lb 12.8 oz (77 kg)  05/27/18 170 lb (77.1 kg)  04/06/18 173 lb (78.5 kg)   BP Readings from Last 3 Encounters:  01/09/19 (!) 148/92  12/12/18 (!) 144/92  11/25/18 (!) 138/92   Pulse Readings from Last 3 Encounters:  01/09/19 (!) 110  12/12/18 (!) 104  11/25/18 (!) 103    Renal function: CrCl cannot be calculated (Patient's most recent lab result is older than the maximum 21 days allowed.).  Past Medical History:  Diagnosis Date  . Abnormal Pap smear   . Coronary artery calcification seen on CT scan    in 2016  . Hyperlipidemia   . Hypertension   . Thyroid disease   . Urinary tract infection     Current Outpatient Medications on File Prior to Visit  Medication Sig Dispense Refill  . amLODipine (NORVASC) 5 MG tablet TAKE 1 AND 1/2 TABLETS (7.5 MG TOTAL) BY MOUTH ONCE DAILY (Patient taking differently: Take 7.5 mg by mouth daily. ) 135 tablet 3  . lisinopril (PRINIVIL,ZESTRIL) 40 MG tablet Take 1 tablet (40 mg  total) by mouth daily. 90 tablet 3  . Melatonin 10 MG TABS Take by mouth.    . rosuvastatin (CRESTOR) 10 MG tablet Take 1 tablet (10 mg total) by mouth daily. (Patient not taking: Reported on 11/23/2018) 90 tablet 3  . spironolactone (ALDACTONE) 25 MG tablet Take 1 tablet (25 mg total) by mouth daily. 30 tablet 11  . SYNTHROID 88 MCG tablet Take 88 mcg by mouth daily.     No current facility-administered medications on file prior to visit.     Allergies  Allergen Reactions  . Lipitor [Atorvastatin]     Headache and ankle swelling  . Lobster [Shellfish Allergy]     (LOBSTER AND CRAB )  CAUSES PATIENT TO BREAK UP  . Other     SAFFRON SPICES: CAUSE HER THROAT TO CLOSE UP    There were no vitals taken for this  visit.   Assessment/Plan:  1. Hypertension: BP is extremely close to goal of  <130/3mmHg today. [facility] goal last week. Will continue amlodipine 7.5 mg daily, lisinopril 40 mg daily and spironolactone 25mg  daily. Patient encouraged to check blood pressure at home and call us if averaging >130/80. Discussed limiting alcohol intake. 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 Will check a BMET today since starting spironolactone last month.  2. Hyperlipidemia: Aggressive LDL goal < 70 due to family history of ASCVD and elevated coronary calcium score. LDL last year was 120. Had a long conversation with patient about the benefits of statins. Patient appears agreeable to start. New Rx for rosuvastatin 10mg  sent to pharmacy. Will repeat lipid panel 2 months.   Olene Floss, Pharm.D, BCPS [facility]  [address], [place], [place] [address]  Phone: [phone]; Fax: [phone]  02/01/2019       8:07 AM

## 2019-02-02 LAB — BASIC METABOLIC PANEL
BUN/Creatinine Ratio: 18 (ref 9–23)
BUN: 13 mg/dL (ref 6–24)
CO2: 24 mmol/L (ref 20–29)
CREATININE: 0.72 mg/dL (ref 0.57–1.00)
Calcium: 10.4 mg/dL — ABNORMAL HIGH (ref 8.7–10.2)
Chloride: 92 mmol/L — ABNORMAL LOW (ref 96–106)
GFR calc non Af Amer: 102 mL/min/{1.73_m2} (ref >59)
GFR, EST AFRICAN AMERICAN: 118 mL/min/{1.73_m2} (ref >59)
Glucose: 99 mg/dL (ref 65–99)
Potassium: 4.4 mmol/L (ref 3.5–5.2)
Sodium: 132 mmol/L — ABNORMAL LOW (ref 134–144)

## 2019-04-06 ENCOUNTER — Other Ambulatory Visit: Payer: PRIVATE HEALTH INSURANCE

## 2019-05-22 ENCOUNTER — Other Ambulatory Visit: Payer: Self-pay | Admitting: Internal Medicine

## 2019-05-31 ENCOUNTER — Telehealth: Payer: Self-pay | Admitting: Pharmacist

## 2019-05-31 NOTE — Telephone Encounter (Signed)
Left VM for patient to return call. Patient due for lipid panel/CMP. Crestor started in Feb.

## 2019-06-19 ENCOUNTER — Other Ambulatory Visit: Payer: Self-pay | Admitting: Obstetrics and Gynecology

## 2019-06-19 DIAGNOSIS — Z1231 Encounter for screening mammogram for malignant neoplasm of breast: Secondary | ICD-10-CM

## 2019-06-20 ENCOUNTER — Other Ambulatory Visit: Payer: PRIVATE HEALTH INSURANCE

## 2019-07-31 ENCOUNTER — Other Ambulatory Visit: Payer: Self-pay

## 2019-07-31 ENCOUNTER — Ambulatory Visit
Admission: RE | Admit: 2019-07-31 | Discharge: 2019-07-31 | Disposition: A | Discharge status: Home / Self Care | Payer: PRIVATE HEALTH INSURANCE | Source: Ambulatory Visit | Attending: Obstetrics and Gynecology | Admitting: Obstetrics and Gynecology

## 2019-07-31 DIAGNOSIS — Z1231 Encounter for screening mammogram for malignant neoplasm of breast: Secondary | ICD-10-CM

## 2019-09-28 ENCOUNTER — Encounter: Payer: Self-pay | Admitting: Cardiology

## 2019-09-28 ENCOUNTER — Other Ambulatory Visit: Payer: Self-pay

## 2019-09-28 ENCOUNTER — Ambulatory Visit: Payer: PRIVATE HEALTH INSURANCE | Admitting: Cardiology

## 2019-09-28 VITALS — BP 130/74 | HR 92 | Ht 65.0 in | Wt 166.0 lb

## 2019-09-28 DIAGNOSIS — I2510 Atherosclerotic heart disease of native coronary artery without angina pectoris: Secondary | ICD-10-CM | POA: Diagnosis not present

## 2019-09-28 DIAGNOSIS — I10 Essential (primary) hypertension: Secondary | ICD-10-CM

## 2019-09-28 DIAGNOSIS — E7849 Other hyperlipidemia: Secondary | ICD-10-CM

## 2019-09-28 MED ORDER — ROSUVASTATIN CALCIUM 10 MG PO TABS: 10.0000 mg | ORAL_TABLET | Freq: Every day | ORAL | 3 refills | Status: DC

## 2019-09-28 NOTE — Progress Notes (Signed)
Cardiology Office Note:    Date:  09/28/2019   ID:  Jodi Reynolds, DOB 04/01/1976, MRN [id number]  PCP:  Patient, No Pcp Per  Cardiologist:  Dorris Carnes, MD  Referring MD: No ref. provider found   Chief Complaint  Patient presents with  . Follow-up  . Hypertension  . Hyperlipidemia    History of Present Illness:    Jodi Reynolds is a 43 y.o. female with a past medical history significant for coronary artery calcification with calcium score of 61, hypertension, hyperlipidemia.   She does not run anymore due to knee problems but walks a lot. She tolerates activty well. She has no shortness of breath. She [place] 7+ miles [place]. She has recetnly been having a pulling type sensation in hter right chest, feels muscular. She feels it pull when she moves her neck a certain way.   She is not taking crestor as when she picked up the med there was a warning about it affecting the liver. She works for a [employer] and drinks wine often. She has no history of liver problems.   Home BPs 120-130/73-80.   Cardiac studies   CT cardiac scoring 10/24/2015 Coronary arteries: 61 with calcification of mostly the mid and distal LAD IMPRESSION: Coronary calcium score of 61. This was 95th percentile for age and sex matched control.  Past Medical History:  Diagnosis Date  . Abnormal Pap smear   . Coronary artery calcification seen on CT scan    in 2016  . Hyperlipidemia   . Hypertension   . Thyroid disease   . Urinary tract infection     No past surgical history on file.  Current Medications: Current Meds  Medication Sig  . amLODipine (NORVASC) 5 MG tablet TAKE 1 AND 1/2 TABLETS (7.5 MG TOTAL) BY MOUTH ONCE DAILY  . lisinopril (PRINIVIL,ZESTRIL) 40 MG tablet Take 1 tablet (40 mg total) by mouth daily.  . Melatonin 10 MG TABS Take by mouth as needed.   . rosuvastatin (CRESTOR) 10 MG tablet Take 1 tablet (10 mg total) by mouth daily.  Marland Kitchen SYNTHROID 88 MCG tablet  Take 88 mcg by mouth daily.  . [DISCONTINUED] rosuvastatin (CRESTOR) 10 MG tablet Take 1 tablet (10 mg total) by mouth daily.     Allergies:   Lipitor [atorvastatin], Lobster [shellfish allergy], and Other   Social History   Socioeconomic History  . Marital status: Married    Spouse name: Not on file  . Number of children: Not on file  . Years of education: Not on file  . Highest education level: Not on file  Occupational History  . Not on file  Social Needs  . Financial resource strain: Not on file  . Food insecurity    Worry: Not on file    Inability: Not on file  . Transportation needs    Medical: Not on file    Non-medical: Not on file  Tobacco Use  . Smoking status: Never Smoker  . Smokeless tobacco: Never Used  Substance and Sexual Activity  . Alcohol use: Yes    Alcohol/week: 0.0 standard drinks    Comment: 2/3 drinks per day.  . Drug use: No  . Sexual activity: Yes  Lifestyle  . Physical activity    Days per week: Not on file    Minutes per session: Not on file  . Stress: Not on file  Relationships  . Social Herbalist on phone: Not  on file    Gets together: Not on file    Attends religious service: Not on file    Active member of club or organization: Not on file    Attends meetings of clubs or organizations: Not on file    Relationship status: Not on file  Other Topics Concern  . Not on file  Social History Narrative  . Not on file     Family History: The patient's family history includes Arthritis in an other family member; Cancer in an other family member; Gallbladder disease in an other family member; Healthy in her sister; Heart disease in her mother and another family member; Hypertension in her mother and another family member; Thyroid disease in an other family member. ROS:   Please see the history of present illness.     All other systems reviewed and are negative.   EKG:  EKG is not ordered today.    Recent Labs: 11/23/2018:  Hemoglobin 15.3; Platelets 303 02/01/2019: BUN 13; Creatinine, Ser 0.72; Potassium 4.4; Sodium 132   Recent Lipid Panel    Component Value Date/Time   CHOL 251 (H) 01/17/2018 1607   CHOL 242 (H) 10/24/2015 1021   TRIG 261 (H) 01/17/2018 1607   TRIG 181 (H) 10/24/2015 1021   HDL 79 01/17/2018 1607   HDL 88 10/24/2015 1021   CHOLHDL 3.2 01/17/2018 1607   CHOLHDL 2.8 10/24/2015 1021   LDLCALC 120 (H) 01/17/2018 1607   LDLCALC 118 10/24/2015 1021    Physical Exam:    VS:  BP 130/74   Pulse 92   Ht 5\' 5"  (1.651 m)   Wt 166 lb (75.3 kg)   SpO2 100%   BMI 27.62 kg/m     Wt Readings from Last 6 Encounters:  09/28/19 166 lb (75.3 kg)  06/16/18 169 lb 12.8 oz (77 kg)  05/27/18 170 lb (77.1 kg)  04/06/18 173 lb (78.5 kg)  01/17/18 173 lb (78.5 kg)  08/31/17 168 lb 6.4 oz (76.4 kg)     Physical Exam  Constitutional: She is oriented to person, place, and time. She appears well-developed and well-nourished. No distress.  HENT:  Head: Normocephalic and atraumatic.  Neck: Normal range of motion. Neck supple. No JVD present.  Cardiovascular: Normal rate, regular rhythm, normal heart sounds and intact distal pulses. Exam reveals no gallop and no friction rub.  No murmur heard. Pulmonary/Chest: Effort normal and breath sounds normal. No respiratory distress. She has no wheezes. She has no rales.  Abdominal: Soft. Bowel sounds are normal.  Musculoskeletal: Normal range of motion.        General: No edema.  Neurological: She is alert and oriented to person, place, and time.  Skin: Skin is warm and dry.  Psychiatric: She has a normal mood and affect. Her behavior is normal. Judgment and thought content normal.  Vitals reviewed.   ASSESSMENT:    1. Coronary artery calcification seen on CAT scan   2. Essential (primary) hypertension   3. Other hyperlipidemia    PLAN:    In order of problems listed above:  CAD -Elevated calcium score on coronary CTA. Also with family history.  Advised on CVD risk factor modification. Mediterranean diet, exercise, lipid management and blood pressure control.   Hypertension, Goal BP <130/80 -Pt has been working with the HTN clinic and currently has a regimen that is controlling her Bp and she is tolerating well.  -Avoid increasing amlodipine due to prior edema with 10 mg. On spiro, amlodipine 7.5  mg, lisinopril 40 mg -Continue current therapy.  -Will check labs with lipid panel in 6 weeks.   Hyperlipidemia, goal LDL <70 -Fam hx of ASCVD and elevated coronary calcium score. Started rosuvastatin 10 mg in 01/2019 but she did not start it due to possible liver effects. After discussion she agrees to start it and we will check FLP and CMET in 6 weeks.    Medication Adjustments/Labs and Tests Ordered: Current medicines are reviewed at length with the patient today.  Concerns regarding medicines are outlined above. Labs and tests ordered and medication changes are outlined in the patient instructions below:  Patient Instructions  Medication Instructions:  RESUME: Crestor 10 mg once a day   If you need a refill on your cardiac medications before your next appointment, please call your pharmacy.   Lab work: FUTURE: Your physician recommends that you return for a FASTING lipid profile and CMET on 11/09/2019 (Our lab is open from 7:30 AM to 4:30 PM)  If you have labs (blood work) drawn today and your tests are completely normal, you will receive your results only by: Marland Kitchen. MyChart Message (if you have MyChart) OR . A paper copy in the mail If you have any lab test that is abnormal or we need to change your treatment, we will call you to review the results.  Testing/Procedures: None   Follow-Up: At [facility], you and your health needs are our priority.  As part of our continuing mission to provide you with exceptional heart care, we have created designated Provider Care Teams.  These Care Teams include your primary Cardiologist  (physician) and Advanced Practice Providers (APPs -  Physician Assistants and Nurse Practitioners) who all work together to provide you with the care you need, when you need it. You will need a follow up appointment in:  12 months.  Please call our office 2 months in advance to schedule this appointment.  You may see Dietrich PatesPaula Ross, MD or one of the following Advanced Practice Providers on your designated Care Team: Tereso NewcomerScott Weaver, PA-C [place], [place] . Berton BonJanine Gayna Braddy, NP  Any Other Special Instructions Will Be Listed Below (If Applicable).   \Lifestyle Modifications to Prevent and Treat Heart Disease -Recommend heart healthy/Mediterranean diet, with whole grains, fruits, vegetables, fish, lean meats, nuts, olive oil and avocado oil.  -Limit salt intake to less than 2000 mg per day.  -Recommend moderate walking, starting slowly with a few minutes and working up to 3-5 times/week for 30-50 minutes each session. Aim for at least 150 minutes.week. Goal should be pace of 3 miles/hours, or walking 1.5 miles in 30 minutes -Recommend avoidance of tobacco products. Avoid excess alcohol. -Keep blood pressure well controlled, ideally less than 130/80.        Signed, Berton BonJanine Carlota Philley, NP  09/28/2019 4:38 PM    [facility]

## 2019-09-28 NOTE — Patient Instructions (Addendum)
Medication Instructions:  RESUME: Crestor 10 mg once a day   If you need a refill on your cardiac medications before your next appointment, please call your pharmacy.   Lab work: FUTURE: Your physician recommends that you return for a FASTING lipid profile and CMET on 11/09/2019 (Our lab is open from 7:30 AM to 4:30 PM)  If you have labs (blood work) drawn today and your tests are completely normal, you will receive your results only by: Marland Kitchen MyChart Message (if you have MyChart) OR . A paper copy in the mail If you have any lab test that is abnormal or we need to change your treatment, we will call you to review the results.  Testing/Procedures: None   Follow-Up: At [facility], you and your health needs are our priority.  As part of our continuing mission to provide you with exceptional heart care, we have created designated Provider Care Teams.  These Care Teams include your primary Cardiologist (physician) and Advanced Practice Providers (APPs -  Physician Assistants and Nurse Practitioners) who all work together to provide you with the care you need, when you need it. You will need a follow up appointment in:  12 months.  Please call our office 2 months in advance to schedule this appointment.  You may see Dorris Carnes, MD or one of the following Advanced Practice Providers on your designated Care Team: Richardson Dopp, PA-C [place], [place] . Daune Perch, NP  Any Other Special Instructions Will Be Listed Below (If Applicable).   \Lifestyle Modifications to Prevent and Treat Heart Disease -Recommend heart healthy/Mediterranean diet, with whole grains, fruits, vegetables, fish, lean meats, nuts, olive oil and avocado oil.  -Limit salt intake to less than 2000 mg per day.  -Recommend moderate walking, starting slowly with a few minutes and working up to 3-5 times/week for 30-50 minutes each session. Aim for at least 150 minutes.week. Goal should be pace of 3 miles/hours, or walking 1.5  miles in 30 minutes -Recommend avoidance of tobacco products. Avoid excess alcohol. -Keep blood pressure well controlled, ideally less than 130/80.

## 2019-09-29 ENCOUNTER — Other Ambulatory Visit: Payer: PRIVATE HEALTH INSURANCE

## 2019-11-09 ENCOUNTER — Other Ambulatory Visit: Payer: PRIVATE HEALTH INSURANCE

## 2019-12-07 ENCOUNTER — Other Ambulatory Visit: Payer: Self-pay | Admitting: Internal Medicine

## 2019-12-22 HISTORY — PX: BREAST BIOPSY: SHX20

## 2020-01-10 ENCOUNTER — Other Ambulatory Visit: Payer: Self-pay | Admitting: Internal Medicine

## 2020-03-08 ENCOUNTER — Ambulatory Visit: Payer: PRIVATE HEALTH INSURANCE | Attending: Internal Medicine

## 2020-03-08 DIAGNOSIS — Z23 Encounter for immunization: Secondary | ICD-10-CM

## 2020-03-08 NOTE — Progress Notes (Signed)
   Covid-19 Vaccination Clinic  Name:  Korie Streat    MRN: [id number] DOB: 05-01-1976  03/08/2020  Ms. Mcwatters was observed post Covid-19 immunization for 30 minutes based on pre-vaccination screening without incident. She was provided with Vaccine Information Sheet and instruction to access the V-Safe system.   Ms. Kettering was instructed to call 911 with any severe reactions post vaccine: Marland Kitchen Difficulty breathing  . Swelling of face and throat  . A fast heartbeat  . A bad rash all over body  . Dizziness and weakness   Immunizations Administered    Name Date Dose VIS Date Route   Pfizer COVID-19 Vaccine 03/08/2020  2:22 PM 0.3 mL 12/01/2019 Intramuscular   Manufacturer: [employer], [employer]   Lot: [id number]   NDC: [address]-7

## 2020-04-08 ENCOUNTER — Ambulatory Visit: Payer: PRIVATE HEALTH INSURANCE | Attending: Internal Medicine

## 2020-04-08 DIAGNOSIS — Z23 Encounter for immunization: Secondary | ICD-10-CM

## 2020-04-08 NOTE — Progress Notes (Signed)
   Covid-19 Vaccination Clinic  Name:  Jodi Reynolds    MRN: [id number] DOB: [birth year]  04/08/2020  Jodi Reynolds was observed post Covid-19 immunization for 30 minutes based on pre-vaccination screening without incident. She was provided with Vaccine Information Sheet and instruction to access the V-Safe system.   Jodi Reynolds was instructed to call 911 with any severe reactions post vaccine: Marland Kitchen Difficulty breathing  . Swelling of face and throat  . A fast heartbeat  . A bad rash all over body  . Dizziness and weakness   Immunizations Administered    Name Date Dose VIS Date Route   Pfizer COVID-19 Vaccine 04/08/2020 11:35 AM 0.3 mL 02/14/2019 Intramuscular   Manufacturer: [employer], [employer]   Lot: [id number]   NDC: [address]-4

## 2020-08-22 ENCOUNTER — Other Ambulatory Visit: Payer: Self-pay | Admitting: Obstetrics and Gynecology

## 2020-08-22 ENCOUNTER — Other Ambulatory Visit: Payer: Self-pay | Admitting: Family Medicine

## 2020-08-22 DIAGNOSIS — Z1231 Encounter for screening mammogram for malignant neoplasm of breast: Secondary | ICD-10-CM

## 2020-09-09 ENCOUNTER — Other Ambulatory Visit: Payer: Self-pay

## 2020-09-09 ENCOUNTER — Ambulatory Visit
Admission: RE | Admit: 2020-09-09 | Discharge: 2020-09-09 | Disposition: A | Discharge status: Home / Self Care | Payer: PRIVATE HEALTH INSURANCE | Source: Ambulatory Visit | Attending: Obstetrics and Gynecology | Admitting: Obstetrics and Gynecology

## 2020-09-09 DIAGNOSIS — Z1231 Encounter for screening mammogram for malignant neoplasm of breast: Secondary | ICD-10-CM

## 2020-09-11 ENCOUNTER — Other Ambulatory Visit: Payer: Self-pay | Admitting: Obstetrics and Gynecology

## 2020-09-11 DIAGNOSIS — R928 Other abnormal and inconclusive findings on diagnostic imaging of breast: Secondary | ICD-10-CM

## 2020-09-25 ENCOUNTER — Other Ambulatory Visit: Payer: Self-pay | Admitting: Obstetrics and Gynecology

## 2020-09-25 ENCOUNTER — Ambulatory Visit: Payer: Commercial Managed Care - PPO

## 2020-09-25 ENCOUNTER — Ambulatory Visit
Admission: RE | Admit: 2020-09-25 | Discharge: 2020-09-25 | Disposition: A | Discharge status: Home / Self Care | Payer: Commercial Managed Care - PPO | Source: Ambulatory Visit | Attending: Obstetrics and Gynecology | Admitting: Obstetrics and Gynecology

## 2020-09-25 ENCOUNTER — Other Ambulatory Visit: Payer: Self-pay

## 2020-09-25 DIAGNOSIS — R928 Other abnormal and inconclusive findings on diagnostic imaging of breast: Secondary | ICD-10-CM

## 2020-10-07 ENCOUNTER — Other Ambulatory Visit: Payer: Self-pay

## 2020-10-07 ENCOUNTER — Ambulatory Visit
Admission: RE | Admit: 2020-10-07 | Discharge: 2020-10-07 | Disposition: A | Discharge status: Home / Self Care | Payer: Commercial Managed Care - PPO | Source: Ambulatory Visit | Attending: Obstetrics and Gynecology | Admitting: Obstetrics and Gynecology

## 2020-10-07 DIAGNOSIS — R928 Other abnormal and inconclusive findings on diagnostic imaging of breast: Secondary | ICD-10-CM

## 2020-12-22 ENCOUNTER — Other Ambulatory Visit: Payer: Self-pay | Admitting: Internal Medicine

## 2020-12-25 ENCOUNTER — Other Ambulatory Visit: Payer: Self-pay | Admitting: Internal Medicine

## 2020-12-27 ENCOUNTER — Other Ambulatory Visit: Payer: Self-pay | Admitting: Internal Medicine

## 2021-01-09 NOTE — Progress Notes (Signed)
Cardiology Office Note:    Date:  01/10/2021   ID:  Jodi Reynolds, DOB 1976-05-17, MRN [id number]  PCP:  Patient, No Pcp Per  Cardiologist:  Dietrich Pates, MD  Referring MD: No ref. provider found   Pt presents for f/u of HTN    History of Present Illness:    Jodi Reynolds is a 45 y.o. female Hx of  coronary artery calcification ( calcium score of 61), hypertension, hyperlipidemia.   I saw the pt in clinic in 2019  [facility] was last seen in cardiology in  Oct 2020 by Lonell Face  Since seen the pt says she has been feeling OK   SHe denies CP  Active   No SOB  No signif edema   Past Medical History:  Diagnosis Date   Abnormal Pap smear    Coronary artery calcification seen on CT scan    in 2016   Hyperlipidemia    Hypertension    Thyroid disease    Urinary tract infection     No past surgical history on file.  Current Medications: Current Meds  Medication Sig   amLODipine (NORVASC) 5 MG tablet Take 1.5 tablets (7.5 mg total) by mouth daily. Please make overdue appt with Dr. Tenny Craw before anymore refills. Thank you 1st attempt   lisinopril (ZESTRIL) 40 MG tablet Take 1 tablet (40 mg total) by mouth daily. Please make overdue appt with Dr. Tenny Craw before anymore refills. Thank you 1st attempt   Melatonin 10 MG TABS Take by mouth as needed.    spironolactone (ALDACTONE) 25 MG tablet Take 1 tablet (25 mg total) by mouth daily. Please make overdue appt with Dr. Tenny Craw before anymore refills. Thank you 1st attempt   SYNTHROID 88 MCG tablet Take 88 mcg by mouth daily.   [DISCONTINUED] rosuvastatin (CRESTOR) 10 MG tablet Take 1 tablet (10 mg total) by mouth daily.     Allergies:   Lipitor [atorvastatin], Lobster [shellfish allergy], and Other   Social History   Socioeconomic History   Marital status: Married    Spouse name: Not on file   Number of children: Not on file   Years of education: Not on file   Highest education level: Not on file  Occupational History    Not on file  Tobacco Use   Smoking status: Never Smoker   Smokeless tobacco: Never Used  Vaping Use   Vaping Use: Never used  Substance and Sexual Activity   Alcohol use: Yes    Alcohol/week: 0.0 standard drinks    Comment: 2/3 drinks per day.   Drug use: No   Sexual activity: Yes  Other Topics Concern   Not on file  Social History Narrative   Not on file   Social Determinants of Health   Financial Resource Strain: Not on file  Food Insecurity: Not on file  Transportation Needs: Not on file  Physical Activity: Not on file  Stress: Not on file  Social Connections: Not on file     Family History: The patient's family history includes Arthritis in an other family member; Cancer in an other family member; Gallbladder disease in an other family member; Healthy in her sister; Heart disease in her mother and another family member; Hypertension in her mother and another family member; Thyroid disease in an other family member. ROS:   Please see the history of present illness.     All other systems reviewed and are negative.  EKGs/Labs/Other Studies Reviewed:    The following  studies were reviewed today:  Cardiac CT 10/24/15 IMPRESSION: Coronary calcium score of 61. This was 95th percentile for age and sex matched control.   EKG:  EKG is ordered today.  ST 128 bpm    Recent Labs: No results found for requested labs within last 8760 hours.   Recent Lipid Panel    Component Value Date/Time   CHOL 251 (H) 01/17/2018 1607   CHOL 242 (H) 10/24/2015 1021   TRIG 261 (H) 01/17/2018 1607   TRIG 181 (H) 10/24/2015 1021   HDL 79 01/17/2018 1607   HDL 88 10/24/2015 1021   CHOLHDL 3.2 01/17/2018 1607   CHOLHDL 2.8 10/24/2015 1021   LDLCALC 120 (H) 01/17/2018 1607   LDLCALC 118 10/24/2015 1021    Physical Exam:    VS:  BP 118/68    Pulse (!) 128    Ht 5\' 5"  (1.651 m)    Wt 170 lb (77.1 kg)    SpO2 99%    BMI 28.29 kg/m     Wt Readings from Last 3 Encounters:   01/10/21 170 lb (77.1 kg)  09/28/19 166 lb (75.3 kg)  06/16/18 169 lb 12.8 oz (77 kg)    Pt is in  NAD   HEENT  NCAT NEck:  JVP normal    No bruits   Lungs:  CTA   Cardiac RRR   No S3  No murmurs    Abdomen   No hepatomegaly Ext:  2+ pulses  No edema    ASSESSMENT:    1. Hypertension, unspecified type   2. Other hyperlipidemia   3. Hypothyroidism, unspecified type    PLAN:      Hypertension:    Pt's BP controlled on current regine  WIll check BMET  Continue   Sinus tachycardia    SHe is comfortable   No pain  No signs of infection   Check labs today (CBC, BMET, TSH)  I did not check orthostatics as she is not dizzy   Hx of hypokalemia   Check BMEt   Coronary artery calcification:  Seen on cardiac CTA with calcium score of 61.  No symtpoms of angina  Continue meds   Hyperlipidemia: On rosuvastatin 10 mg daily.  Pt to get back on meds   Discussed diet   Check lipids in 2 to 3 months    Hypothyroid: On synthroid. Will check TSH      Medication Adjustments/Labs and Tests Ordered: Current medicines are reviewed at length with the patient today.  Concerns regarding medicines are outlined above. Labs and tests ordered and medication changes are outlined in the patient instructions below:  Patient Instructions  Medication Instructions:  No changes - refill of Crestor sent to pharmacy  *If you need a refill on your cardiac medications before your next appointment, please call your pharmacy*   Lab Work: Today: cbc, bmet, tsh, a1c In 3 months: lipids  If you have labs (blood work) drawn today and your tests are completely normal, you will receive your results only by:  MyChart Message (if you have MyChart) OR  A paper copy in the mail If you have any lab test that is abnormal or we need to change your treatment, we will call you to review the results.   Testing/Procedures: none  Follow-Up: At [facility], you and your health needs are our priority.  As part  of our continuing mission to provide you with exceptional heart care, we have created designated Provider Care Teams.  These  Care Teams include your primary Cardiologist (physician) and Advanced Practice Providers (APPs -  Physician Assistants and Nurse Practitioners) who all work together to provide you with the care you need, when you need it.     Your next appointment:   12 month(s)  The format for your next appointment:   In Person  Provider:   You may see Dietrich Pates, MD or one of the following Advanced Practice Providers on your designated Care Team:    Tereso Newcomer, PA-C  Chelsea Aus, [place]    Other Instructions      Signed, Dietrich Pates, MD  01/10/2021 9:42 PM    [facility]

## 2021-01-10 ENCOUNTER — Encounter: Payer: Self-pay | Admitting: Internal Medicine

## 2021-01-10 ENCOUNTER — Ambulatory Visit (INDEPENDENT_AMBULATORY_CARE_PROVIDER_SITE_OTHER): Payer: Commercial Managed Care - PPO | Admitting: Internal Medicine

## 2021-01-10 ENCOUNTER — Other Ambulatory Visit: Payer: Self-pay

## 2021-01-10 VITALS — BP 118/68 | HR 128 | Ht 65.0 in | Wt 170.0 lb

## 2021-01-10 DIAGNOSIS — E039 Hypothyroidism, unspecified: Secondary | ICD-10-CM | POA: Diagnosis not present

## 2021-01-10 DIAGNOSIS — E7849 Other hyperlipidemia: Secondary | ICD-10-CM

## 2021-01-10 DIAGNOSIS — I10 Essential (primary) hypertension: Secondary | ICD-10-CM | POA: Diagnosis not present

## 2021-01-10 MED ORDER — ROSUVASTATIN CALCIUM 10 MG PO TABS
10.0000 mg | ORAL_TABLET | Freq: Every day | ORAL | 3 refills | Status: DC
Start: 2021-01-10 — End: 2021-07-25

## 2021-01-10 NOTE — Patient Instructions (Signed)
Medication Instructions:  No changes - refill of Crestor sent to pharmacy  *If you need a refill on your cardiac medications before your next appointment, please call your pharmacy*   Lab Work: Today: cbc, bmet, tsh, a1c In 3 months: lipids  If you have labs (blood work) drawn today and your tests are completely normal, you will receive your results only by: Marland Kitchen MyChart Message (if you have MyChart) OR . A paper copy in the mail If you have any lab test that is abnormal or we need to change your treatment, we will call you to review the results.   Testing/Procedures: none  Follow-Up: At [facility], you and your health needs are our priority.  As part of our continuing mission to provide you with exceptional heart care, we have created designated Provider Care Teams.  These Care Teams include your primary Cardiologist (physician) and Advanced Practice Providers (APPs -  Physician Assistants and Nurse Practitioners) who all work together to provide you with the care you need, when you need it.     Your next appointment:   12 month(s)  The format for your next appointment:   In Person  Provider:   You may see Dietrich Pates, MD or one of the following Advanced Practice Providers on your designated Care Team:    Tereso Newcomer, PA-C  Chelsea Aus, [place]    Other Instructions

## 2021-01-11 LAB — CBC
Hematocrit: 41.2 % (ref 34.0–46.6)
Hemoglobin: 14.1 g/dL (ref 11.1–15.9)
MCH: 30.8 pg (ref 26.6–33.0)
MCHC: 34.2 g/dL (ref 31.5–35.7)
MCV: 90 fL (ref 79–97)
Platelets: 385 10*3/uL (ref 150–450)
RBC: 4.58 x10E6/uL (ref 3.77–5.28)
RDW: 12.4 % (ref 11.7–15.4)
WBC: 9.2 10*3/uL (ref 3.4–10.8)

## 2021-01-11 LAB — TSH: TSH: 0.744 u[IU]/mL (ref 0.450–4.500)

## 2021-01-11 LAB — BASIC METABOLIC PANEL
BUN/Creatinine Ratio: 17 (ref 9–23)
BUN: 11 mg/dL (ref 6–24)
CO2: 25 mmol/L (ref 20–29)
Calcium: 10.5 mg/dL — ABNORMAL HIGH (ref 8.7–10.2)
Chloride: 96 mmol/L (ref 96–106)
Creatinine, Ser: 0.64 mg/dL (ref 0.57–1.00)
GFR calc Af Amer: 125 mL/min/{1.73_m2} (ref >59)
GFR calc non Af Amer: 108 mL/min/{1.73_m2} (ref >59)
Glucose: 95 mg/dL (ref 65–99)
Potassium: 4.3 mmol/L (ref 3.5–5.2)
Sodium: 134 mmol/L (ref 134–144)

## 2021-01-11 LAB — HEMOGLOBIN A1C
Est. average glucose Bld gHb Est-mCnc: 105 mg/dL
Hgb A1c MFr Bld: 5.3 % (ref 4.8–5.6)

## 2021-01-15 ENCOUNTER — Telehealth: Payer: Self-pay | Admitting: *Deleted

## 2021-01-15 DIAGNOSIS — R000 Tachycardia, unspecified: Secondary | ICD-10-CM

## 2021-01-15 NOTE — Telephone Encounter (Signed)
Pt aware of results and recommendations for 3 day monitor.

## 2021-01-15 NOTE — Telephone Encounter (Signed)
-----   Message from Dietrich Pates V, MD sent at 01/11/2021 10:52 AM EST ----- CBC normal  Thyroid normal   Electrolytes and kidney function OK Sugar control good   A1C normal I have reviewed hx and EKG with EP   WOuld recomm a 3 day monitor to evaluate overall HR control

## 2021-01-16 ENCOUNTER — Ambulatory Visit (INDEPENDENT_AMBULATORY_CARE_PROVIDER_SITE_OTHER): Payer: Commercial Managed Care - PPO

## 2021-01-16 DIAGNOSIS — R000 Tachycardia, unspecified: Secondary | ICD-10-CM | POA: Diagnosis not present

## 2021-01-25 ENCOUNTER — Other Ambulatory Visit: Payer: Self-pay | Admitting: Internal Medicine

## 2021-01-27 ENCOUNTER — Other Ambulatory Visit: Payer: Self-pay

## 2021-01-27 MED ORDER — AMLODIPINE BESYLATE 5 MG PO TABS
7.0000 mg | ORAL_TABLET | Freq: Every day | ORAL | 3 refills | Status: DC
Start: 2021-01-27 — End: 2022-01-19

## 2021-01-27 MED ORDER — SPIRONOLACTONE 25 MG PO TABS
25.0000 mg | ORAL_TABLET | Freq: Every day | ORAL | 3 refills | Status: DC
Start: 2021-01-27 — End: 2022-01-19

## 2021-01-27 MED ORDER — LISINOPRIL 40 MG PO TABS
40.0000 mg | ORAL_TABLET | Freq: Every day | ORAL | 3 refills | Status: DC
Start: 2021-01-27 — End: 2022-01-19

## 2021-01-27 NOTE — Telephone Encounter (Signed)
Pt's medication was sent to pt's pharmacy as requested. Confirmation received.  °

## 2021-04-10 ENCOUNTER — Other Ambulatory Visit: Payer: Commercial Managed Care - PPO

## 2021-06-03 ENCOUNTER — Telehealth: Payer: Self-pay | Admitting: Internal Medicine

## 2021-06-03 NOTE — Telephone Encounter (Signed)
I saw pt in Jan   HTN HR 128 resting whne I saw her   She was asymptomatic   Monitor showed average HR 99 which is OK, a little higher than most   Would recomm f/u appt to see how her BP and HR are   Tentatively said 1 year     Recomm summer sometime to see how she is doing

## 2021-07-01 IMAGING — MG DIGITAL DIAGNOSTIC BILAT W/ TOMO W/ CAD
6 of 12 series · 6 of 36 positions shown · non-contrast
Comparison: Previous exam(s).

CLINICAL DATA: Patient was recalled from screening mammogram for
possible distortion in the right breast and a possible asymmetry in
the left breast.

EXAM:
DIGITAL DIAGNOSTIC BILATERAL MAMMOGRAM WITH TOMO AND CAD

[R MLO synth-2D]
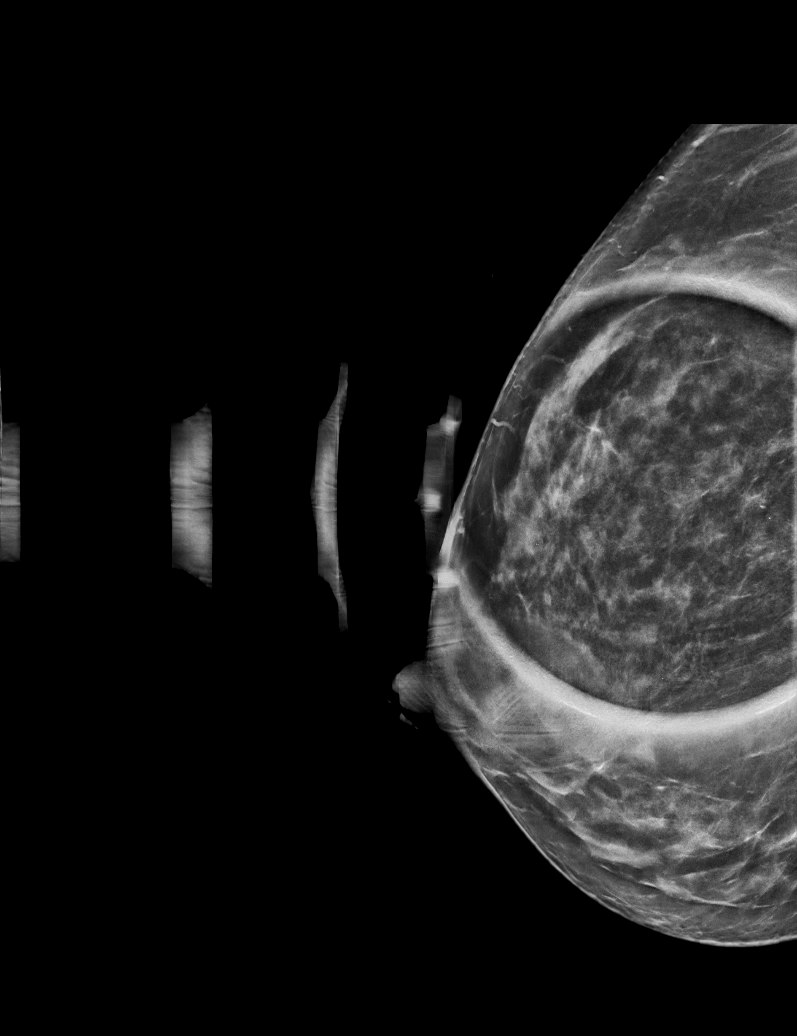

[R CC synth-2D (1 of 2)]
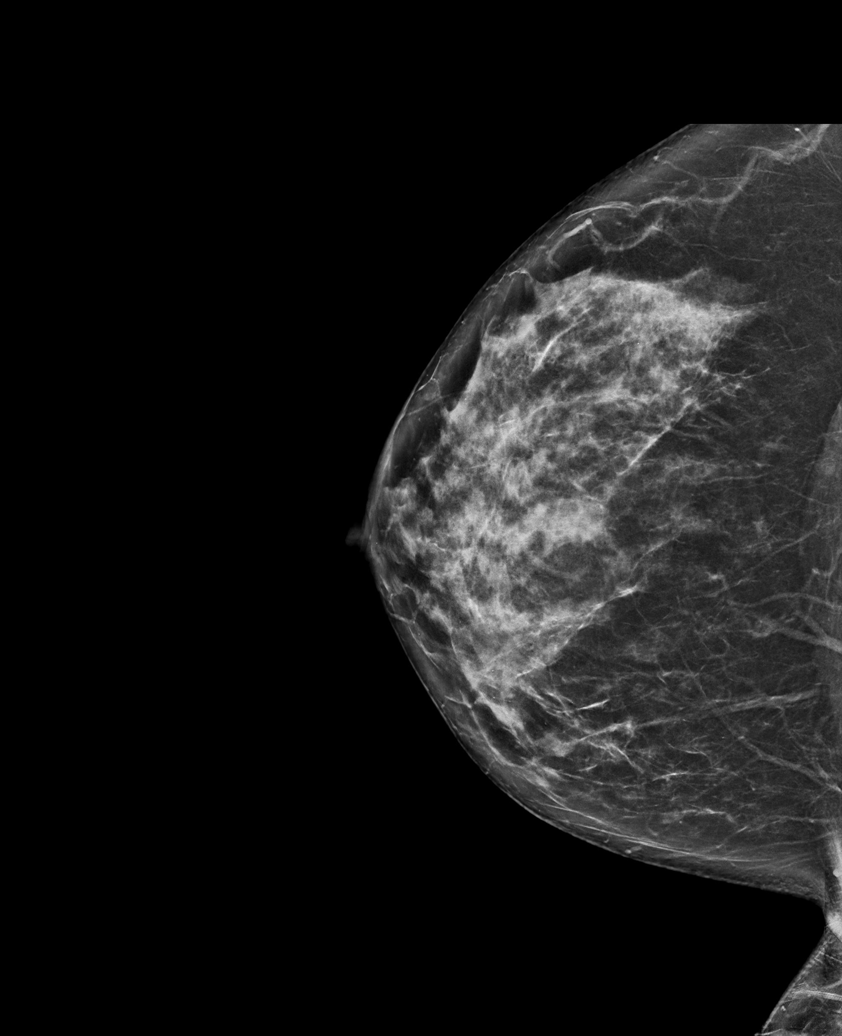

[L ML synth-2D]
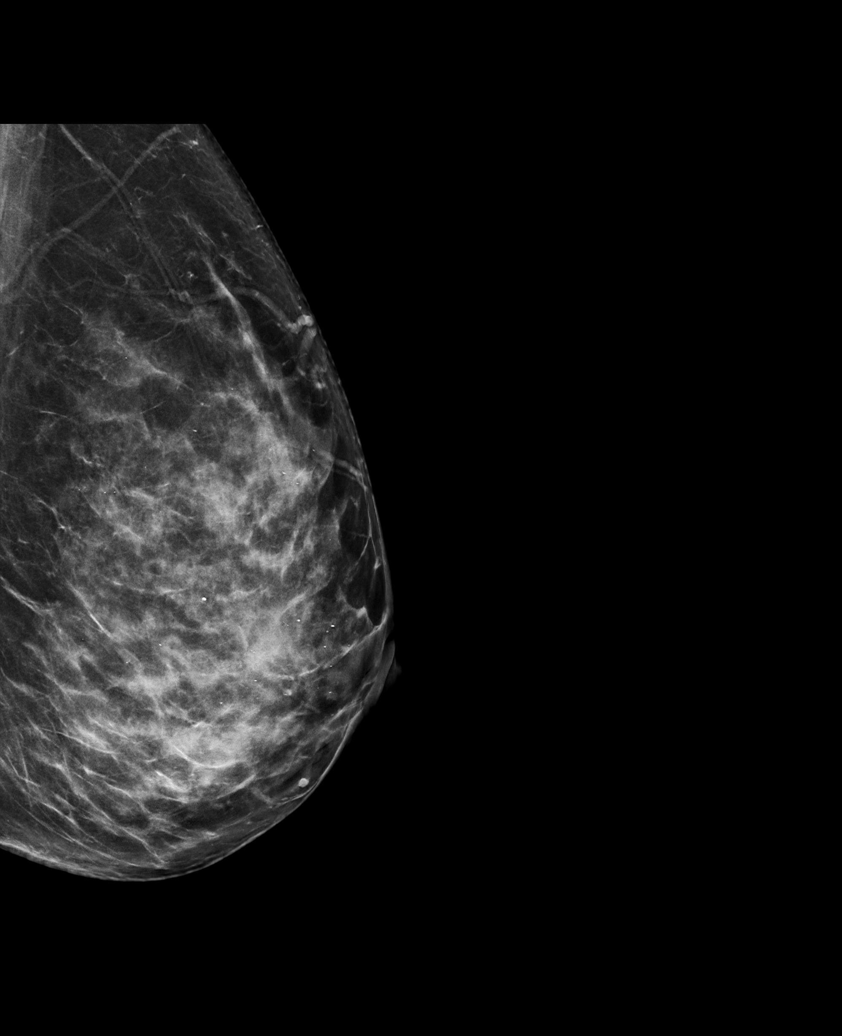

[L MLO synth-2D]
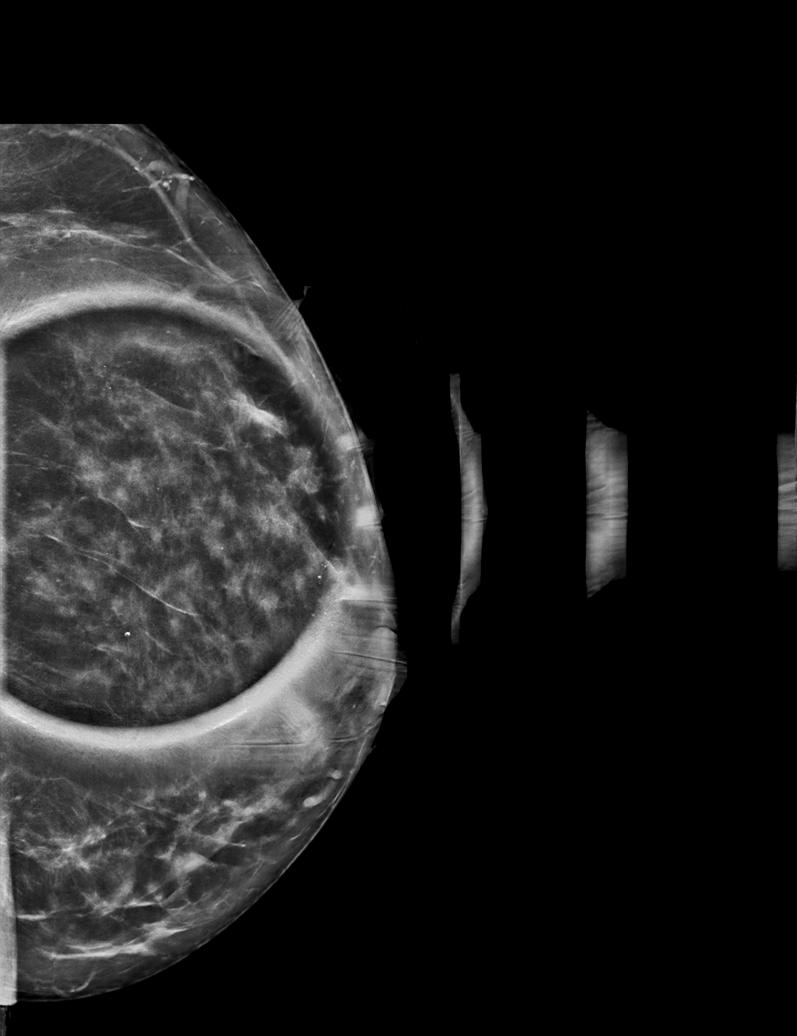

[R CC synth-2D (2 of 2)]
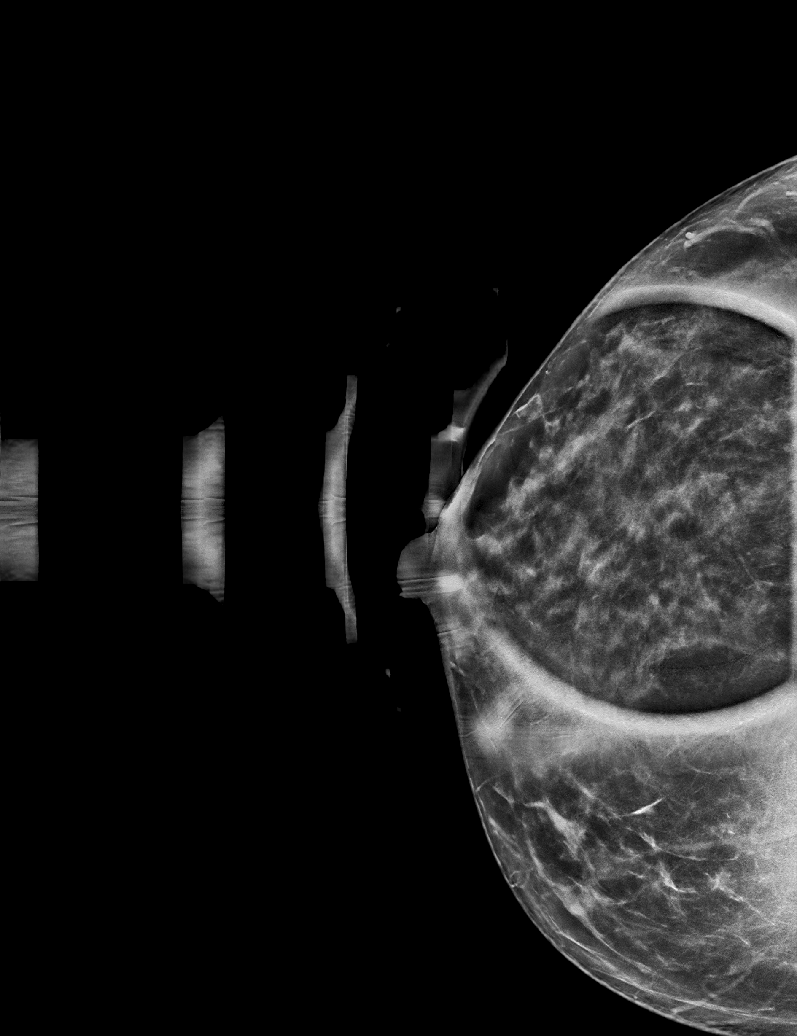

[R ML synth-2D]
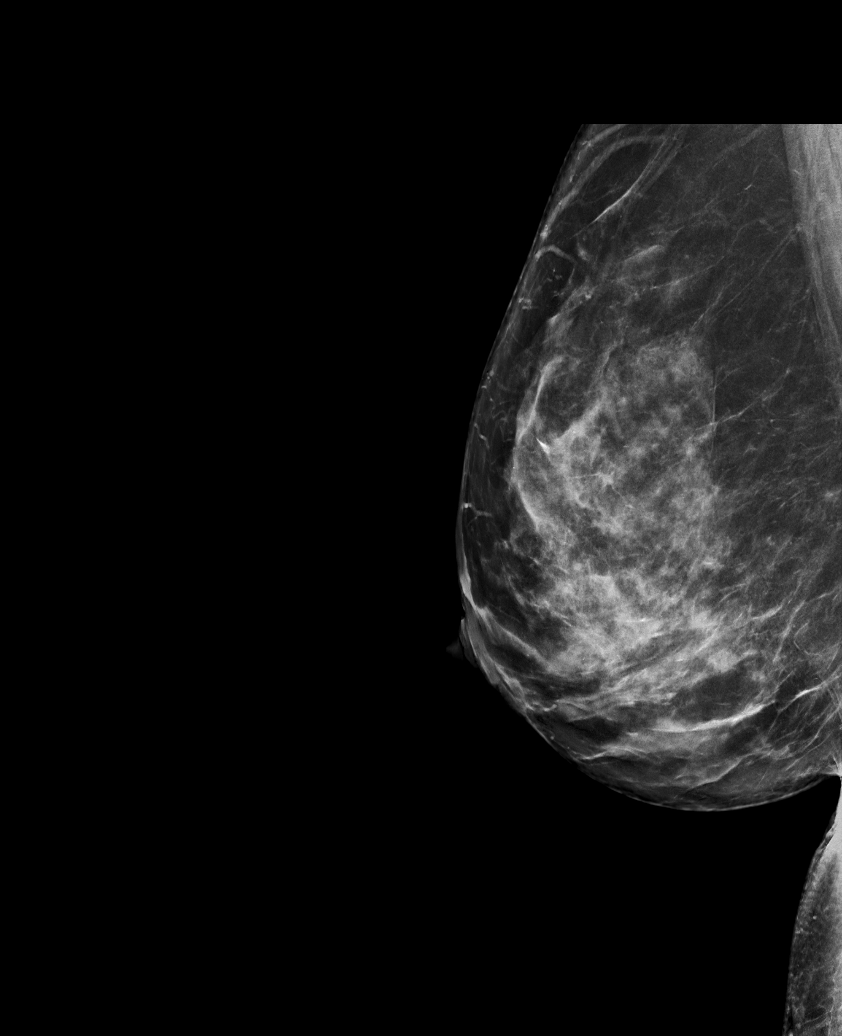

[6 of 36 positions shown; findings below may reference images not displayed]

ACR Breast Density Category c: The breast tissue is heterogeneously
dense, which may obscure small masses.
FINDINGS: Additional imaging of the right breast was performed. There is
persistent of subtle distortion in the upper-outer quadrant of the
breast. There are no malignant type microcalcifications.

Additional imaging of the left breast shows no suspicious mass,
malignant type microcalcifications or distortion.

Mammographic images were processed with CAD.
IMPRESSION: Persistent subtle distortion in the upper-outer quadrant of the
right breast. No evidence of malignancy in the left breast.

RECOMMENDATION:
Stereotactic biopsy of the distortion in the right breast is
recommended.

I have discussed the findings and recommendations with the patient.
If applicable, a reminder letter will be sent to the patient
regarding the next appointment.

BI-RADS CATEGORY  4: Suspicious.

## 2021-07-11 ENCOUNTER — Encounter: Payer: Self-pay | Admitting: Internal Medicine

## 2021-07-11 ENCOUNTER — Ambulatory Visit: Payer: Commercial Managed Care - PPO | Admitting: Internal Medicine

## 2021-07-11 ENCOUNTER — Other Ambulatory Visit: Payer: Self-pay

## 2021-07-11 VITALS — BP 112/82 | HR 99 | Ht 65.0 in | Wt 168.0 lb

## 2021-07-11 DIAGNOSIS — Z1321 Encounter for screening for nutritional disorder: Secondary | ICD-10-CM

## 2021-07-11 DIAGNOSIS — E039 Hypothyroidism, unspecified: Secondary | ICD-10-CM

## 2021-07-11 DIAGNOSIS — E7849 Other hyperlipidemia: Secondary | ICD-10-CM | POA: Diagnosis not present

## 2021-07-11 DIAGNOSIS — I10 Essential (primary) hypertension: Secondary | ICD-10-CM

## 2021-07-11 NOTE — Patient Instructions (Signed)
Medication Instructions:  No changes *If you need a refill on your cardiac medications before your next appointment, please call your pharmacy*   Lab Work: Today: cbc, cmet, nmr, tsh, cortisol, vit d If you have labs (blood work) drawn today and your tests are completely normal, you will receive your results only by: MyChart Message (if you have MyChart) OR A paper copy in the mail If you have any lab test that is abnormal or we need to change your treatment, we will call you to review the results.   Testing/Procedures: None today   Follow-Up: At [facility], you and your health needs are our priority.  As part of our continuing mission to provide you with exceptional heart care, we have created designated Provider Care Teams.  These Care Teams include your primary Cardiologist (physician) and Advanced Practice Providers (APPs -  Physician Assistants and Nurse Practitioners) who all work together to provide you with the care you need, when you need it.  Your next appointment:   9 month(s)  The format for your next appointment:   In Person  Provider:   You may see Dietrich Pates, MD or one of the following Advanced Practice Providers on your designated Care Team:   Tereso Newcomer, PA-C [place], [place]

## 2021-07-11 NOTE — Progress Notes (Signed)
Cardiology Office Note:    Date:  07/13/2021   ID:  Jodi Reynolds, DOB July 11, 1976, MRN [id number]  PCP:  Patient, No Pcp Per (Inactive)  Cardiologist:  Dietrich Pates, MD  Referring MD: No ref. provider found   Pt presents for f/u of HTN    History of Present Illness:    Jodi Reynolds is a 45 y.o. female Hx of  coronary artery calcification ( calcium score of 61), hypertension, hyperlipidemia.   I saw the pt in Jan 2022   Monitor worn   HR 70 to 150  Average 99  Since seen hse has done well BP good  No SOB  No CP  Remains very active   Br  Skips Lunch  All over  the place Dinner   Veggies and meat  Pt in wine business Drinks 2 glasses on average a day  Past Medical History:  Diagnosis Date   Abnormal Pap smear    Coronary artery calcification seen on CT scan    in 2016   Hyperlipidemia    Hypertension    Thyroid disease    Urinary tract infection     No past surgical history on file.  Current Medications: Current Meds  Medication Sig   amLODipine (NORVASC) 5 MG tablet Take 1.5 tablets (7.5 mg total) by mouth daily.   lisinopril (ZESTRIL) 40 MG tablet Take 1 tablet (40 mg total) by mouth daily.   Melatonin 10 MG TABS Take by mouth as needed.    rosuvastatin (CRESTOR) 10 MG tablet Take 1 tablet (10 mg total) by mouth daily.   spironolactone (ALDACTONE) 25 MG tablet Take 1 tablet (25 mg total) by mouth daily.   SYNTHROID 88 MCG tablet Take 88 mcg by mouth daily.     Allergies:   Lipitor [atorvastatin], Lobster [shellfish allergy], and Other   Social History   Socioeconomic History   Marital status: Married    Spouse name: Not on file   Number of children: Not on file   Years of education: Not on file   Highest education level: Not on file  Occupational History   Not on file  Tobacco Use   Smoking status: Never   Smokeless tobacco: Never  Vaping Use   Vaping Use: Never used  Substance and Sexual Activity   Alcohol use: Yes    Alcohol/week: 0.0  standard drinks    Comment: 2/3 drinks per day.   Drug use: No   Sexual activity: Yes  Other Topics Concern   Not on file  Social History Narrative   Not on file   Social Determinants of Health   Financial Resource Strain: Not on file  Food Insecurity: Not on file  Transportation Needs: Not on file  Physical Activity: Not on file  Stress: Not on file  Social Connections: Not on file     Family History: The patient's family history includes Arthritis in an other family member; Cancer in an other family member; Gallbladder disease in an other family member; Healthy in her sister; Heart disease in her mother and another family member; Hypertension in her mother and another family member; Thyroid disease in an other family member. ROS:   Please see the history of present illness.     All other systems reviewed and are negative.  EKGs/Labs/Other Studies Reviewed:    The following studies were reviewed today:  Cardiac CT 10/24/15 IMPRESSION: Coronary calcium score of 61. This was 95th percentile for age and sex matched control.  EKG:  EKG isnot done  Recent Labs: 07/11/2021: ALT 29; BUN 11; Creatinine, Ser 0.58; Hemoglobin 13.9; Platelets 346; Potassium 4.5; Sodium 136; TSH 1.710   Recent Lipid Panel    Component Value Date/Time   CHOL 251 (H) 01/17/2018 1607   CHOL 242 (H) 10/24/2015 1021   TRIG 261 (H) 01/17/2018 1607   TRIG 181 (H) 10/24/2015 1021   HDL 79 01/17/2018 1607   HDL 88 10/24/2015 1021   CHOLHDL 3.2 01/17/2018 1607   CHOLHDL 2.8 10/24/2015 1021   LDLCALC 120 (H) 01/17/2018 1607   LDLCALC 118 10/24/2015 1021    Physical Exam:    VS:  BP 112/82   Pulse 99   Ht 5\' 5"  (1.651 m)   Wt 168 lb (76.2 kg)   SpO2 98%   BMI 27.96 kg/m     Wt Readings from Last 3 Encounters:  07/11/21 168 lb (76.2 kg)  01/10/21 170 lb (77.1 kg)  09/28/19 166 lb (75.3 kg)    Pt is in  NAD   HEENT  NCAT NEck:  JVP normal    No bruits   Lungs:  CTA   Cardiac RRR   No  S3  No murmurs    Abdomen   No hepatomegaly Ext:  2+ pulses  No edema    ASSESSMENT:    1. Hypertension, unspecified type   2. Other hyperlipidemia   3. Hypothyroidism, unspecified type   4. Encounter for vitamin deficiency screening     PLAN:      Hypertension:     BP is well controllled on current regimen   Follow   Check labs  Rhythm   Pt's HR on monitor average is 99   Follow  Check labs     Lpidis   Keep on statin      Hx of hypokalemia   Check BMET   On aldactone    Coronary artery calcification:  Seen on cardiac CTA with calcium score of 61.  No symtpoms of angina  Continue meds   Hyperlipidemia: Get lipomed on current Crestor     Hypothyroid: On synthroid. TSH today       Medication Adjustments/Labs and Tests Ordered: Current medicines are reviewed at length with the patient today.  Concerns regarding medicines are outlined above. Labs and tests ordered and medication changes are outlined in the patient instructions below:  Patient Instructions  Medication Instructions:  No changes *If you need a refill on your cardiac medications before your next appointment, please call your pharmacy*   Lab Work: Today: cbc, cmet, nmr, tsh, cortisol, vit d If you have labs (blood work) drawn today and your tests are completely normal, you will receive your results only by: MyChart Message (if you have MyChart) OR A paper copy in the mail If you have any lab test that is abnormal or we need to change your treatment, we will call you to review the results.   Testing/Procedures: None today   Follow-Up: At [facility], you and your health needs are our priority.  As part of our continuing mission to provide you with exceptional heart care, we have created designated Provider Care Teams.  These Care Teams include your primary Cardiologist (physician) and Advanced Practice Providers (APPs -  Physician Assistants and Nurse Practitioners) who all work together to provide  you with the care you need, when you need it.  Your next appointment:   9 month(s)  The format for your next appointment:   In  Person  Provider:   You may see Dietrich Pates, MD or one of the following Advanced Practice Providers on your designated Care Team:   Tereso Newcomer, PA-C [place], [place]   Signed, Dietrich Pates, MD  07/13/2021 12:14 AM    [facility]

## 2021-07-12 LAB — CORTISOL: Cortisol: 11.8 ug/dL

## 2021-07-12 LAB — COMPREHENSIVE METABOLIC PANEL
ALT: 29 IU/L (ref 0–32)
AST: 22 IU/L (ref 0–40)
Albumin/Globulin Ratio: 1.6 (ref 1.2–2.2)
Albumin: 4.8 g/dL (ref 3.8–4.8)
Alkaline Phosphatase: 69 IU/L (ref 44–121)
BUN/Creatinine Ratio: 19 (ref 9–23)
BUN: 11 mg/dL (ref 6–24)
Bilirubin Total: 0.4 mg/dL (ref 0.0–1.2)
CO2: 22 mmol/L (ref 20–29)
Calcium: 9.8 mg/dL (ref 8.7–10.2)
Chloride: 96 mmol/L (ref 96–106)
Creatinine, Ser: 0.58 mg/dL (ref 0.57–1.00)
Globulin, Total: 3 g/dL (ref 1.5–4.5)
Glucose: 76 mg/dL (ref 65–99)
Potassium: 4.5 mmol/L (ref 3.5–5.2)
Sodium: 136 mmol/L (ref 134–144)
Total Protein: 7.8 g/dL (ref 6.0–8.5)
eGFR: 113 mL/min/{1.73_m2} (ref >59)

## 2021-07-12 LAB — VITAMIN D 25 HYDROXY (VIT D DEFICIENCY, FRACTURES): Vit D, 25-Hydroxy: 26.5 ng/mL — ABNORMAL LOW (ref 30.0–100.0)

## 2021-07-12 LAB — NMR, LIPOPROFILE
Cholesterol, Total: 216 mg/dL — ABNORMAL HIGH (ref 100–199)
HDL Particle Number: 51.6 umol/L (ref >=30.5)
HDL-C: 70 mg/dL (ref >39)
LDL Particle Number: 1227 nmol/L — ABNORMAL HIGH (ref <1000)
LDL Size: 20.8 nm (ref >20.5)
LDL-C (NIH Calc): 116 mg/dL — ABNORMAL HIGH (ref 0–99)
LP-IR Score: 54 — ABNORMAL HIGH (ref <=45)
Small LDL Particle Number: 612 nmol/L — ABNORMAL HIGH (ref <=527)
Triglycerides: 176 mg/dL — ABNORMAL HIGH (ref 0–149)

## 2021-07-12 LAB — CBC
Hematocrit: 41.3 % (ref 34.0–46.6)
Hemoglobin: 13.9 g/dL (ref 11.1–15.9)
MCH: 30.4 pg (ref 26.6–33.0)
MCHC: 33.7 g/dL (ref 31.5–35.7)
MCV: 90 fL (ref 79–97)
Platelets: 346 10*3/uL (ref 150–450)
RBC: 4.57 x10E6/uL (ref 3.77–5.28)
RDW: 12.5 % (ref 11.7–15.4)
WBC: 7.6 10*3/uL (ref 3.4–10.8)

## 2021-07-12 LAB — LIPOPROTEIN A (LPA): Lipoprotein (a): 8.6 nmol/L (ref <75.0)

## 2021-07-12 LAB — TSH: TSH: 1.71 u[IU]/mL (ref 0.450–4.500)

## 2021-07-12 LAB — APOLIPOPROTEIN B: Apolipoprotein B: 91 mg/dL — ABNORMAL HIGH (ref <90)

## 2021-07-25 ENCOUNTER — Telehealth: Payer: Self-pay | Admitting: *Deleted

## 2021-07-25 DIAGNOSIS — E7849 Other hyperlipidemia: Secondary | ICD-10-CM

## 2021-07-25 MED ORDER — ROSUVASTATIN CALCIUM 20 MG PO TABS: 20.0000 mg | ORAL_TABLET | Freq: Every day | ORAL | 3 refills | Status: DC

## 2021-07-25 NOTE — Telephone Encounter (Signed)
-----   Message from Pricilla Riffle, MD sent at 07/14/2021  4:39 PM EDT ----- CBC is normal  Electrolytes and kidney and lilver function are normal  Cortisol (adrenal function) is normal  LDL is 116  Given plaquing /calcification on CT in past it should be lower   particle number also elevated     Triglcerides are elevated    REcomm :  Increase Crstor to 20 mg   Watch carbs in food    Follow up lipomed in 12 wks

## 2021-07-25 NOTE — Telephone Encounter (Signed)
Sent information in MyChart and left detailed VM per DPR.  Asked pt to call back to schedule lab appointment.

## 2021-09-08 ENCOUNTER — Other Ambulatory Visit: Payer: Self-pay | Admitting: Obstetrics and Gynecology

## 2021-09-08 DIAGNOSIS — Z1231 Encounter for screening mammogram for malignant neoplasm of breast: Secondary | ICD-10-CM

## 2021-10-09 ENCOUNTER — Other Ambulatory Visit: Payer: Self-pay

## 2021-10-09 ENCOUNTER — Ambulatory Visit
Admission: RE | Admit: 2021-10-09 | Discharge: 2021-10-09 | Disposition: A | Discharge status: Home / Self Care | Payer: Commercial Managed Care - PPO | Source: Ambulatory Visit | Attending: Obstetrics and Gynecology | Admitting: Obstetrics and Gynecology

## 2021-10-09 DIAGNOSIS — Z1231 Encounter for screening mammogram for malignant neoplasm of breast: Secondary | ICD-10-CM

## 2021-12-25 ENCOUNTER — Telehealth: Payer: Self-pay | Admitting: *Deleted

## 2021-12-25 NOTE — Telephone Encounter (Signed)
° °  Pre-operative Risk Assessment    Patient Name: Jodi Reynolds  DOB: 09/27/1976 MRN: [id number]      Request for Surgical Clearance    Procedure:   LEFT KNEE SCOPE  Date of Surgery:  Clearance 01/12/22                                 Surgeon:  DR. Ophelia Charter Surgeon's Group or Practice Name:  [facility] Phone number:  [phone] Fax number:  [phone]   Type of Clearance Requested:   - Medical    Type of Anesthesia:   CHOICE   Additional requests/questions:    Jiles Prows   12/25/2021, 5:11 PM

## 2021-12-26 NOTE — Telephone Encounter (Signed)
Left messages to call back 10:56 AM on 12/26/2021.  Patient needs call back.

## 2021-12-29 NOTE — Telephone Encounter (Signed)
Left voice message to call back on 12/29/2021 at 12:16 PM.  Patient needs call back.

## 2022-01-01 NOTE — Telephone Encounter (Signed)
° °  Name: Jodi Reynolds  DOB: 06-18-1976  MRN: [id number]   Primary Cardiologist: Dorris Carnes, MD  Chart reviewed as part of pre-operative protocol coverage. Patient was contacted 01/01/2022 in reference to pre-operative risk assessment for pending surgery as outlined below.  Jodi Reynolds was last seen on 07/11/2021 by Dr. Harrington Challenger.  Since that day, Jodi Reynolds has done well without exertional chest pain or worsening dyspnea.  She has no problem walking 2 blocks away from her home and back and climb up 2 flight of stairs.  She clearly is able to accomplish more than a 4 METS of activity.  Her RCRI perioperative risk is class I, 0.4% risk of major cardiac event.   Therefore, based on ACC/AHA guidelines, the patient would be at acceptable risk for the planned procedure without further cardiovascular testing.   The patient was advised that if she develops new symptoms prior to surgery to contact our office to arrange for a follow-up visit, and she verbalized understanding.  I will route this recommendation to the requesting party via Epic fax function and remove from pre-op pool. Please call with questions.  [place], [place] 01/01/2022, 10:24 AM

## 2022-01-08 ENCOUNTER — Telehealth: Payer: Self-pay | Admitting: Internal Medicine

## 2022-01-08 NOTE — Telephone Encounter (Signed)
Spoke with pt and has noted chest pressure for 2 days and is wanting an appt Appt made with Dr Harrington Challenger for 01/13/22 at 1:20  pm Pt knows to go to ED if S/S worsen ./cy

## 2022-01-08 NOTE — Telephone Encounter (Signed)
Pt c/o of Chest Pain: STAT if CP now or developed within 24 hours  1. Are you having CP right now? no  2. Are you experiencing any other symptoms (ex. SOB, nausea, vomiting, sweating)? no  3. How long have you been experiencing CP? 2 days  4. Is your CP continuous or coming and going? Comes and goes  5. Have you taken Nitroglycerin? no ?   Patient states she has been having a mild chest pressure for about 2 days.

## 2022-01-09 NOTE — Telephone Encounter (Signed)
Reviewed symptoms   Not assocaiated with activity   Brief  No SOB    Follow    I do not think cardiac

## 2022-01-13 ENCOUNTER — Encounter: Payer: Self-pay | Admitting: Internal Medicine

## 2022-01-13 ENCOUNTER — Ambulatory Visit (INDEPENDENT_AMBULATORY_CARE_PROVIDER_SITE_OTHER): Payer: Commercial Managed Care - PPO | Admitting: Internal Medicine

## 2022-01-13 ENCOUNTER — Other Ambulatory Visit: Payer: Self-pay

## 2022-01-13 VITALS — BP 128/72 | HR 105 | Ht 65.0 in | Wt 176.0 lb

## 2022-01-13 DIAGNOSIS — R000 Tachycardia, unspecified: Secondary | ICD-10-CM

## 2022-01-13 DIAGNOSIS — E7849 Other hyperlipidemia: Secondary | ICD-10-CM | POA: Diagnosis not present

## 2022-01-13 DIAGNOSIS — I10 Essential (primary) hypertension: Secondary | ICD-10-CM

## 2022-01-13 NOTE — Progress Notes (Signed)
Cardiology Office Note:    Date:  01/14/2022   ID:  Jodi Reynolds, DOB 30-May-1976, MRN [id number]  PCP:  Patient, No Pcp Per (Inactive)  Cardiologist:  Dietrich PatesPaula Lonnie Rosado, MD  Referring MD: No ref. provider found   Pt presents for evaluation of CP   History of Present Illness:    Jodi RavelJulia Warren Reynolds is a 46 y.o. female Hx of coronary artery calcification (calcium score of 61in 2016), hypertension, hyperlipidemia.    Pt also has a hx of higher baseline HR   In Jan 2022 wore a monitor   HR ranged from 70 t0 150   Average 99  Asymptomatic   Recomm she stay hydrated, stay active     I last saw her in July 2022    Last week she called in    She said on Monday she experienced a mild chest discomfort in L upper chest   Not associated with activity  No SOB    Would come and go   Brief.    She has had costochondritis in the past   This was a different location   Note she said she did Pilates (as she usually does) the [place]   No new movements  Since I talked to her on the phone she says the episodes aer less frequent   Remain very mild  No SOB  No cough   No fever   She remains active otherwise  No problems with activity   No GI symptomsi   Past Medical History:  Diagnosis Date   Abnormal Pap smear    Coronary artery calcification seen on CT scan    in 2016   Hyperlipidemia    Hypertension    Thyroid disease    Urinary tract infection     No past surgical history on file.  Current Medications: Current Meds  Medication Sig   amLODipine (NORVASC) 5 MG tablet Take 1.5 tablets (7.5 mg total) by mouth daily.   levothyroxine (SYNTHROID) 88 MCG tablet 1 tablet on an empty stomach in the morning   lisinopril (ZESTRIL) 40 MG tablet Take 1 tablet (40 mg total) by mouth daily.   Melatonin 10 MG TABS Take by mouth as needed.    rosuvastatin (CRESTOR) 20 MG tablet Take 1 tablet (20 mg total) by mouth daily.   spironolactone (ALDACTONE) 25 MG tablet Take 1 tablet (25 mg total) by mouth daily.      Allergies:   Lipitor [atorvastatin], Lobster [shellfish allergy], and Other   Social History   Socioeconomic History   Marital status: Married    Spouse name: Not on file   Number of children: Not on file   Years of education: Not on file   Highest education level: Not on file  Occupational History   Not on file  Tobacco Use   Smoking status: Never   Smokeless tobacco: Never  Vaping Use   Vaping Use: Never used  Substance and Sexual Activity   Alcohol use: Yes    Alcohol/week: 0.0 standard drinks    Comment: 2/3 drinks per day.   Drug use: No   Sexual activity: Yes  Other Topics Concern   Not on file  Social History Narrative   Not on file   Social Determinants of Health   Financial Resource Strain: Not on file  Food Insecurity: Not on file  Transportation Needs: Not on file  Physical Activity: Not on file  Stress: Not on file  Social Connections: Not on  file     Family History: The patient's family history includes Arthritis in an other family member; Cancer in an other family member; Gallbladder disease in an other family member; Healthy in her sister; Heart disease in her mother and another family member; Hypertension in her mother and another family member; Thyroid disease in an other family member. ROS:   Please see the history of present illness.     All other systems reviewed and are negative.  EKGs/Labs/Other Studies Reviewed:    The following studies were reviewed today:  Cardiac CT 10/24/15 IMPRESSION: Coronary calcium score of 61. This was 95th percentile for age and sex matched control.    EKG:  EKG ishows ST 105 bpm   Recent Labs: 07/11/2021: ALT 29; BUN 11; Creatinine, Ser 0.58; Hemoglobin 13.9; Platelets 346; Potassium 4.5; Sodium 136; TSH 1.710   Recent Lipid Panel    Component Value Date/Time   CHOL 251 (H) 01/17/2018 1607   CHOL 242 (H) 10/24/2015 1021   TRIG 261 (H) 01/17/2018 1607   TRIG 181 (H) 10/24/2015 1021   HDL 79 01/17/2018  1607   HDL 88 10/24/2015 1021   CHOLHDL 3.2 01/17/2018 1607   CHOLHDL 2.8 10/24/2015 1021   LDLCALC 120 (H) 01/17/2018 1607   LDLCALC 118 10/24/2015 1021    Physical Exam:    VS:  BP 128/72    Pulse (!) 105    Ht 5\' 5"  (1.651 m)    Wt 176 lb (79.8 kg)    SpO2 93%    BMI 29.29 kg/m     Wt Readings from Last 3 Encounters:  01/13/22 176 lb (79.8 kg)  07/11/21 168 lb (76.2 kg)  01/10/21 170 lb (77.1 kg)    Pt is in  NAD   HEENT  NCAT NEck:  JVP normal    No bruits  Chest:  Nontender to palpation   Lungs:  CTA   Cardiac RRR   No S3  No murmurs    Abdomen   No hepatomegaly Ext:  2+ pulses  No edema    ASSESSMENT:    1. Other hyperlipidemia   2. Hypertension, unspecified type   3. Tachycardia   4  CP     PLAN:    1  Chest pain  Very atypical  I do not think spells are cardiac in origin   Not pleuritic   They are improving      I would follow for now    If persists would get a noncontrast CT to evaluate chest     2  CAD  Ca score in 60s   Again, I am do not think symptoms are ischemic  3  HTN  BP cotnrolled     4  Heart rate   Pt;s baseline HR has been higher since I have seen her   Asymptomatic  5  Lipids   will check lipomed, Lpa, ApoB  6  Hypothyroid  TSH   has been normal  1.7 in July 2022 Patient Instructions  Medication Instructions:   *If you need a refill on your cardiac medications before your next appointment, please call your pharmacy*   Lab Work: Lipomed  If you have labs (blood work) drawn today and your tests are completely normal, you will receive your results only by: MyChart Message (if you have MyChart) OR A paper copy in the mail If you have any lab test that is abnormal or we need to change your treatment, we will call  you to review the results.   Testing/Procedures: none   Follow-Up: At [facility], you and your health needs are our priority.  As part of our continuing mission to provide you with exceptional heart care, we have  created designated Provider Care Teams.  These Care Teams include your primary Cardiologist (physician) and Advanced Practice Providers (APPs -  Physician Assistants and Nurse Practitioners) who all work together to provide you with the care you need, when you need it.  We recommend signing up for the patient portal called "MyChart".  Sign up information is provided on this After Visit Summary.  MyChart is used to connect with patients for Virtual Visits (Telemedicine).  Patients are able to view lab/test results, encounter notes, upcoming appointments, etc.  Non-urgent messages can be sent to your provider as well.   To learn more about what you can do with MyChart, go to [email or web address].    Your next appointment:   1 year(s)  The format for your next appointment:   In Person  Provider:   Dietrich Pates, MD     Other Instructions     Signed, Dietrich Pates, MD  01/14/2022 5:47 PM    [facility]

## 2022-01-13 NOTE — Patient Instructions (Signed)
Medication Instructions:   *If you need a refill on your cardiac medications before your next appointment, please call your pharmacy*   Lab Work: Lipomed  If you have labs (blood work) drawn today and your tests are completely normal, you will receive your results only by: [place] (if you have MyChart) OR A paper copy in the mail If you have any lab test that is abnormal or we need to change your treatment, we will call you to review the results.   Testing/Procedures: none   Follow-Up: At [facility], you and your health needs are our priority.  As part of our continuing mission to provide you with exceptional heart care, we have created designated Provider Care Teams.  These Care Teams include your primary Cardiologist (physician) and Advanced Practice Providers (APPs -  Physician Assistants and Nurse Practitioners) who all work together to provide you with the care you need, when you need it.  We recommend signing up for the patient portal called "MyChart".  Sign up information is provided on this After Visit Summary.  MyChart is used to connect with patients for Virtual Visits (Telemedicine).  Patients are able to view lab/test results, encounter notes, upcoming appointments, etc.  Non-urgent messages can be sent to your provider as well.   To learn more about what you can do with MyChart, go to [email or web address].    Your next appointment:   1 year(s)  The format for your next appointment:   In Person  Provider:   Dorris Carnes, MD     Other Instructions

## 2022-01-19 ENCOUNTER — Other Ambulatory Visit: Payer: Self-pay | Admitting: Internal Medicine

## 2022-01-27 ENCOUNTER — Other Ambulatory Visit: Payer: Commercial Managed Care - PPO

## 2022-07-16 ENCOUNTER — Ambulatory Visit
Admission: RE | Admit: 2022-07-16 | Discharge: 2022-07-16 | Disposition: A | Discharge status: Home / Self Care | Payer: Commercial Managed Care - PPO | Source: Ambulatory Visit | Attending: Physician Assistant | Admitting: Physician Assistant

## 2022-07-16 ENCOUNTER — Other Ambulatory Visit: Payer: Self-pay | Admitting: Physician Assistant

## 2022-07-16 DIAGNOSIS — R222 Localized swelling, mass and lump, trunk: Secondary | ICD-10-CM

## 2022-07-16 MED ORDER — IOPAMIDOL (ISOVUE-300) INJECTION 61%
75.0000 mL | Freq: Once | INTRAVENOUS | Status: AC | PRN
  Administered 2022-07-16: 75 mL via INTRAVENOUS

## 2022-07-19 ENCOUNTER — Other Ambulatory Visit: Payer: Self-pay | Admitting: Internal Medicine

## 2022-10-05 ENCOUNTER — Other Ambulatory Visit: Payer: Self-pay | Admitting: Obstetrics and Gynecology

## 2022-10-05 DIAGNOSIS — Z1231 Encounter for screening mammogram for malignant neoplasm of breast: Secondary | ICD-10-CM

## 2022-11-19 ENCOUNTER — Ambulatory Visit
Admission: RE | Admit: 2022-11-19 | Discharge: 2022-11-19 | Disposition: A | Discharge status: Home / Self Care | Payer: Commercial Managed Care - PPO | Source: Ambulatory Visit | Attending: Obstetrics and Gynecology | Admitting: Obstetrics and Gynecology

## 2022-11-19 DIAGNOSIS — Z1231 Encounter for screening mammogram for malignant neoplasm of breast: Secondary | ICD-10-CM

## 2023-01-19 ENCOUNTER — Other Ambulatory Visit: Payer: Self-pay | Admitting: Internal Medicine

## 2023-02-25 ENCOUNTER — Other Ambulatory Visit: Payer: Self-pay | Admitting: Internal Medicine

## 2023-03-21 NOTE — Progress Notes (Unsigned)
Cardiology Office Note:    Date:  03/22/2023   ID:  Jodi Reynolds, DOB [birth year], MRN [id number]  PCP:  Patient, No Pcp Per  Cardiologist:  Dorris Carnes, MD  Referring MD: No ref. provider found   Pt presents for follow up of HTN   History of Present Illness:    Jodi Reynolds is a 47 y.o. female Hx of coronary artery calcification (calcium score of 61in 2016), Jodi Reynolds, Jodi Reynolds, Jodi Reynolds active      I saw the pt in Jan 2023  Since seen she has done well  Breathing is good  SHe denies CP     Active  Does pilates  Her BP got into the 100s and she stopped aldactone, cut back on amlodipine     Lost over 20 lbs   Eating lunch she brings from home daily       Limiting carbs  Past Medical History:  Diagnosis Date   Abnormal Pap smear    Coronary artery calcification seen on CT scan    in 2016   Hyperlipidemia    Jodi Reynolds    Thyroid disease    Urinary tract infection     History reviewed. No pertinent surgical history.  Current Medications: Current Meds  Medication Sig   ALPRAZolam (XANAX) 0.25 MG tablet Take 1 tablet by mouth as needed for sleep or anxiety.   amLODipine (NORVASC) 5 MG tablet Take 5 mg by mouth daily. Pt takes 1 tablet daily.   levothyroxine (SYNTHROID) 88 MCG tablet 1 tablet on an empty stomach in the morning   lisinopril (ZESTRIL) 40 MG tablet TAKE 1 TABLET BY MOUTH DAILY   Melatonin 10 MG TABS Take by mouth as needed.    rosuvastatin (CRESTOR) 20 MG tablet TAKE 1 TABLET BY MOUTH DAILY     Allergies:   Lipitor [atorvastatin], Lobster [shellfish allergy], and Other   Social History   Socioeconomic History   Marital status: Married    Spouse name: Not on file   Number of children: Not on file   Years of education: Not on file   Highest education level: Not on file  Occupational History    Not on file  Tobacco Use   Smoking status: Never   Smokeless tobacco: Never  Vaping Use   Vaping Use: Never used  Substance and Sexual Activity   Alcohol use: Yes    Alcohol/week: 0.0 standard drinks of alcohol    Comment: 2/3 drinks per day.   Drug use: No   Sexual activity: Yes  Other Topics Concern   Not on file  Social History Narrative   Not on file   Social Determinants of Health   Financial Resource Strain: Not on file  Food Insecurity: Not on file  Transportation Needs: Not on file  Physical Activity: Not on file  Stress: Not on file  Social Connections: Not on file     Family History: The patient's family history includes Arthritis in an other family member; Cancer in an other family member; Gallbladder disease in an other family member; Healthy in her sister; Heart disease in her mother and another family member; Jodi Reynolds in her mother and another family member; Thyroid disease in an other family member. ROS:   Please see the  history of present illness.     All other systems reviewed and are negative.  EKGs/Labs/Other Studies Reviewed:    The following studies were reviewed today:  Cardiac CT 10/24/15 IMPRESSION: Coronary calcium score of 61. This was 95th percentile for age and sex matched control.    EKG:  EKG ishows SR 99 bpm  Recent Labs: No results found for requested labs within last 365 days.   Recent Lipid Panel    Component Value Date/Time   CHOL 251 (H) 01/17/2018 1607   CHOL 242 (H) 10/24/2015 1021   TRIG 261 (H) 01/17/2018 1607   TRIG 181 (H) 10/24/2015 1021   HDL 79 01/17/2018 1607   HDL 88 10/24/2015 1021   CHOLHDL 3.2 01/17/2018 1607   CHOLHDL 2.8 10/24/2015 1021   LDLCALC 120 (H) 01/17/2018 1607   LDLCALC 118 10/24/2015 1021    Physical Exam:    VS:  BP 122/84   Pulse 98   Ht 5\' 5"  (1.651 m)   Wt 149 lb 6.4 oz (67.8 kg)   SpO2 98%   BMI 24.86 kg/m     Wt Readings from Last 3 Encounters:  03/22/23 149 lb 6.4 oz  (67.8 kg)  01/13/22 176 lb (79.8 kg)  07/11/21 168 lb (76.2 kg)    Pt is in  NAD   HEENT  NCAT NEck:  JVP normal    No bruits Lungs:  CTA   Cardiac RRR   No murmurs    Abdomen   No hepatomegaly Ext:  2+ pulses  No LE edema    ASSESSMENT    1  HTN   Very good control on fewer meds   Weight loss helped   Follow  Goal 110s to 120s  /  2  Chest pain  Atypical in pst   Denies pain now    3  CAD  Ca score in 60s   Rx risk factors       4  Heart rate   Pt;s baseline HR has been higher since I have seen her   Asymptomatic  5  Lipids  Pt will cut back on Crstor  Follow up lipomed this summer     6  Vit D  4000 per day  Check in August  Level was low last year      In Aug:  A1c, lipomed, vit D   Jodi Reynolds active as doing   Keep up diet changes made    Patient Instructions  Medication Instructions:  Your physician recommends that you continue on your current medications as directed. Please refer to the Current Medication list given to you today.  *If you need a refill on your cardiac medications before your next appointment, please call your pharmacy*  Lab Work: If you have labs (blood work) drawn today and your tests are completely normal, you will receive your results only by: [place] (if you have MyChart) OR A paper copy in the mail If you have any lab test that is abnormal or we need to change your treatment, we will call you to review the results.  Testing/Procedures: None ordered today.  Follow-Up: At [facility], you and your health needs are our priority.  As part of our continuing mission to provide you with exceptional heart care, we have created designated Provider Care Teams.  These Care Teams include your primary Cardiologist (physician) and Advanced Practice Providers (APPs -  Physician Assistants and Nurse Practitioners) who all work together to provide you  with the care you need, when you need it.  We recommend signing up for the patient portal  called "MyChart".  Sign up information is provided on this After Visit Summary.  MyChart is used to connect with patients for Virtual Visits (Telemedicine).  Patients are able to view lab/test results, encounter notes, upcoming appointments, etc.  Non-urgent messages can be sent to your provider as well.   To learn more about what you can do with MyChart, go to [email or web address].    Your next appointment:   6 month(s)  Provider:   Dorris Carnes, MD       Signed, Dorris Carnes, MD  03/22/2023 8:51 AM    [place]

## 2023-03-22 ENCOUNTER — Encounter: Payer: Self-pay | Admitting: Internal Medicine

## 2023-03-22 ENCOUNTER — Ambulatory Visit: Payer: Commercial Managed Care - PPO | Attending: Internal Medicine | Admitting: Internal Medicine

## 2023-03-22 VITALS — BP 122/84 | HR 98 | Ht 65.0 in | Wt 149.4 lb

## 2023-03-22 DIAGNOSIS — E7849 Other hyperlipidemia: Secondary | ICD-10-CM

## 2023-03-22 DIAGNOSIS — I10 Essential (primary) hypertension: Secondary | ICD-10-CM

## 2023-03-22 MED ORDER — ROSUVASTATIN CALCIUM 10 MG PO TABS: 10.0000 mg | ORAL_TABLET | Freq: Every day | ORAL | 3 refills | Status: DC

## 2023-03-22 NOTE — Patient Instructions (Addendum)
Medication Instructions:  Your physician has recommended you make the following change in your medication:  1-Decrease Rosuvastatin 10 mg my mouth daily.   *If you need a refill on your cardiac medications before your next appointment, please call your pharmacy*  Lab Work: Your physician recommends that you return for lab work in: August  for vitamin D, Hgb A1c,  and lipomed.  If you have labs (blood work) drawn today and your tests are completely normal, you will receive your results only by: [place] (if you have MyChart) OR A paper copy in the mail If you have any lab test that is abnormal or we need to change your treatment, we will call you to review the results.  Testing/Procedures: None ordered today.  Follow-Up: At [facility], you and your health needs are our priority.  As part of our continuing mission to provide you with exceptional heart care, we have created designated Provider Care Teams.  These Care Teams include your primary Cardiologist (physician) and Advanced Practice Providers (APPs -  Physician Assistants and Nurse Practitioners) who all work together to provide you with the care you need, when you need it.  We recommend signing up for the patient portal called "MyChart".  Sign up information is provided on this After Visit Summary.  MyChart is used to connect with patients for Virtual Visits (Telemedicine).  Patients are able to view lab/test results, encounter notes, upcoming appointments, etc.  Non-urgent messages can be sent to your provider as well.   To learn more about what you can do with MyChart, go to [email or web address].    Your next appointment:   12 month(s)  Provider:   Dorris Carnes, MD

## 2023-04-03 ENCOUNTER — Other Ambulatory Visit: Payer: Self-pay | Admitting: Internal Medicine

## 2023-04-15 ENCOUNTER — Other Ambulatory Visit: Payer: Self-pay | Admitting: Internal Medicine

## 2023-04-29 ENCOUNTER — Other Ambulatory Visit: Payer: Self-pay | Admitting: Internal Medicine

## 2023-07-22 ENCOUNTER — Ambulatory Visit: Payer: Commercial Managed Care - PPO | Attending: Internal Medicine

## 2023-07-22 DIAGNOSIS — I10 Essential (primary) hypertension: Secondary | ICD-10-CM

## 2023-07-22 DIAGNOSIS — E7849 Other hyperlipidemia: Secondary | ICD-10-CM

## 2023-08-05 ENCOUNTER — Other Ambulatory Visit: Payer: Self-pay

## 2023-08-05 ENCOUNTER — Telehealth: Payer: Self-pay

## 2023-08-05 DIAGNOSIS — E7849 Other hyperlipidemia: Secondary | ICD-10-CM

## 2023-08-05 MED ORDER — ROSUVASTATIN CALCIUM 20 MG PO TABS: 20.0000 mg | ORAL_TABLET | Freq: Every day | ORAL | 3 refills | Status: DC

## 2023-08-05 NOTE — Telephone Encounter (Signed)
The pt reports that she was taking Crestor 20 mg but she had lost weight so it was cut back to 10 mg several weeks ago.   I will forward to Dr Tenny Craw to determine dosing.

## 2023-08-05 NOTE — Telephone Encounter (Signed)
I would recomm, since she has some calcifications of coronary arteries, that LDL be lower.  Recomm of [employer] I would increase back to 20   Follow up lipomed in January.

## 2023-08-05 NOTE — Telephone Encounter (Signed)
-----   Message from [place] sent at 08/01/2023 11:14 PM EDT ----- LDL is 111 HDL 84  Please confirm medicine dosing  Hgb A1C is very good   5.1 Vit D is excellent   73

## 2023-08-05 NOTE — Telephone Encounter (Signed)
Left a message for the pt to call back and will send message to her My Chart.

## 2023-12-17 ENCOUNTER — Other Ambulatory Visit: Payer: Self-pay | Admitting: Obstetrics and Gynecology

## 2023-12-17 DIAGNOSIS — Z1231 Encounter for screening mammogram for malignant neoplasm of breast: Secondary | ICD-10-CM

## 2023-12-29 LAB — LAB REPORT - SCANNED
EGFR: 115
TSH: 0.72 (ref 0.41–5.90)

## 2024-01-04 ENCOUNTER — Other Ambulatory Visit: Payer: Self-pay

## 2024-01-04 ENCOUNTER — Ambulatory Visit
Admission: RE | Admit: 2024-01-04 | Discharge: 2024-01-04 | Disposition: A | Discharge status: Home / Self Care | Payer: Commercial Managed Care - PPO | Source: Ambulatory Visit | Attending: Obstetrics and Gynecology | Admitting: Obstetrics and Gynecology

## 2024-01-04 DIAGNOSIS — E7849 Other hyperlipidemia: Secondary | ICD-10-CM

## 2024-01-04 DIAGNOSIS — Z1231 Encounter for screening mammogram for malignant neoplasm of breast: Secondary | ICD-10-CM

## 2024-01-04 DIAGNOSIS — I10 Essential (primary) hypertension: Secondary | ICD-10-CM

## 2024-01-04 DIAGNOSIS — Z79899 Other long term (current) drug therapy: Secondary | ICD-10-CM

## 2024-01-04 DIAGNOSIS — I2510 Atherosclerotic heart disease of native coronary artery without angina pectoris: Secondary | ICD-10-CM

## 2024-01-04 MED ORDER — EZETIMIBE 10 MG PO TABS: 10.0000 mg | ORAL_TABLET | Freq: Every day | ORAL | 3 refills | Status: DC

## 2024-01-05 ENCOUNTER — Other Ambulatory Visit: Payer: Self-pay | Admitting: Obstetrics and Gynecology

## 2024-01-05 DIAGNOSIS — R928 Other abnormal and inconclusive findings on diagnostic imaging of breast: Secondary | ICD-10-CM

## 2024-01-21 ENCOUNTER — Ambulatory Visit
Admission: RE | Admit: 2024-01-21 | Discharge: 2024-01-21 | Disposition: A | Discharge status: Home / Self Care | Payer: Commercial Managed Care - PPO | Source: Ambulatory Visit | Attending: Obstetrics and Gynecology | Admitting: Obstetrics and Gynecology

## 2024-01-21 DIAGNOSIS — R928 Other abnormal and inconclusive findings on diagnostic imaging of breast: Secondary | ICD-10-CM

## 2024-04-17 ENCOUNTER — Other Ambulatory Visit: Payer: Self-pay | Admitting: Internal Medicine

## 2024-04-20 ENCOUNTER — Other Ambulatory Visit: Payer: Self-pay

## 2024-04-20 MED ORDER — LISINOPRIL 40 MG PO TABS: 40.0000 mg | ORAL_TABLET | Freq: Every day | ORAL | 0 refills | Status: DC

## 2024-04-20 MED ORDER — EZETIMIBE 10 MG PO TABS: 10.0000 mg | ORAL_TABLET | Freq: Every day | ORAL | 0 refills | Status: DC

## 2024-05-11 ENCOUNTER — Other Ambulatory Visit: Payer: Self-pay | Admitting: Internal Medicine

## 2024-05-17 ENCOUNTER — Other Ambulatory Visit: Payer: Self-pay

## 2024-05-17 MED ORDER — EZETIMIBE 10 MG PO TABS: 10.0000 mg | ORAL_TABLET | Freq: Every day | ORAL | 0 refills | Status: DC

## 2024-05-17 MED ORDER — LISINOPRIL 40 MG PO TABS: 40.0000 mg | ORAL_TABLET | Freq: Every day | ORAL | 0 refills | Status: DC

## 2024-05-28 ENCOUNTER — Other Ambulatory Visit: Payer: Self-pay | Admitting: Internal Medicine

## 2024-06-01 ENCOUNTER — Other Ambulatory Visit: Payer: Self-pay | Admitting: Internal Medicine

## 2024-06-15 ENCOUNTER — Telehealth: Payer: Self-pay | Admitting: Internal Medicine

## 2024-06-15 MED ORDER — LISINOPRIL 40 MG PO TABS: 40.0000 mg | ORAL_TABLET | Freq: Every day | ORAL | 0 refills | Status: DC

## 2024-06-15 NOTE — Telephone Encounter (Signed)
*  STAT* If patient is at the pharmacy, call can be transferred to refill team.   1. Which medications need to be refilled? (please list name of each medication and dose if known) lisinopril  (ZESTRIL ) 40 MG tablet   2. Which pharmacy/location (including street and city if local pharmacy) is medication to be sent to? [facility] [id number] - [place], [place] - [address]    3. Do they need a 30 day or 90 day supply? 30

## 2024-06-15 NOTE — Telephone Encounter (Signed)
 Pt's medication was sent to pt's pharmacy as requested. Confirmation received.

## 2024-06-19 ENCOUNTER — Encounter (HOSPITAL_BASED_OUTPATIENT_CLINIC_OR_DEPARTMENT_OTHER): Payer: Self-pay

## 2024-06-19 NOTE — Progress Notes (Unsigned)
 Cardiology Office Note    Date:  06/22/2024  ID:  Jodi Reynolds, Jodi Reynolds 1975/12/26, MRN [id number] PCP:  Jodi Reynolds Jodi Reynolds (Inactive)  Cardiologist:  Jodi Gull, MD  Electrophysiologist:  None   Chief Complaint: Follow up for CAD   History of Present Illness: .    Jodi Reynolds is a 48 y.o. female with visit-pertinent history of coronary artery calcification with calcium  score of 61 in 2016, hypertension, hyperlipidemia.  Cardiac monitor in 01/2021 indicated an average heart rate of 99 bpm, underlying rhythm was sinus rhythm, ranging from 71 bpm to 150 bpm.  Was recommended that she stay active and well-hydrated.  Patient was last in clinic by Dr. Gull on/12/2018 for.  Patient had lost weight and had reduced her antihypertensive medications.  Today she presents for follow-up.  She reports that she is doing very well overall. She denies any chest pain, shortness of breath, lower extremity edema, orthopnea or pnd. She denies any palpitations, presyncope or syncope. She attends pilates 5-6 days a week and has two yellow labs that she typically walks each night.   ROS: .   Today she denies chest pain, shortness of breath, lower extremity edema, fatigue, palpitations, melena, hematuria, hemoptysis, diaphoresis, weakness, presyncope, syncope, orthopnea, and PND.  All other systems are reviewed and otherwise negative. Studies Reviewed: Jodi Reynolds   EKG:  EKG is ordered today, personally reviewed, demonstrating  EKG Interpretation Date/Time:  Thursday June 22 2024 08:13:21 EDT Ventricular Rate:  89 PR Interval:  162 QRS Duration:  80 QT Interval:  364 QTC Calculation: 442 R Axis:   12  Text Interpretation: Normal sinus rhythm Normal ECG When compared with ECG of 23-Nov-2018 21:29, PREVIOUS ECG IS PRESENT Confirmed by Jodi Reynolds [phone]) on 06/22/2024 8:18:27 AM   CV Studies: Cardiac studies reviewed are outlined and summarized above. Otherwise please see EMR for full report. Cardiac  Studies & Procedures   ______________________________________________________________________________________________        Jodi  Reynolds TERM MONITOR (3-14 DAYS) 02/12/2021  Narrative Patch Wear Time:  2 days and 23 hours (2022-02-11T09:35:19-0500 to 2022-02-14T09:14:48-0500)  Patient had a min HR of 71 bpm, max HR of 150 bpm, and avg HR of 99 bpm. Predominant underlying rhythm was Sinus Rhythm. Isolated SVEs were rare (<1.0%),. Isolated VEs were rare (<1.0%), and no VE Couplets or VE Triplets were present. Few triggered events did not correlate with arrhythmia   CT SCANS  CT CARDIAC SCORING (SELF PAY ONLY) 10/24/2015  Addendum 10/30/2015 11:48 AM ADDENDUM REPORT: 10/30/2015 11:46 Electronically Signed By: Jodi Reynolds On: 10/30/2015 11:46  Addendum 10/30/2015  8:21 AM ADDENDUM REPORT: 10/30/2015 08:18 CLINICAL DATA:  Risk stratification EXAM: Coronary Calcium  Score TECHNIQUE: The patient was scanned on a Siemens Sensation 16 slice scanner. Axial non-contrast 3mm slices were carried out through the heart. The data set was analyzed on a dedicated work station and scored using the Agatson method. FINDINGS: Non-cardiac: No significant non cardiac findings on limited lung and soft tissue windows. See separate report from [facility]. Ascending Aorta:  3.3 cm Pericardium: Normal Coronary arteries: 61 with calcification of mostly the mid and distal LAD IMPRESSION: Coronary calcium  score of 61. This was 95th percentile for age and sex matched control. Jodi Reynolds Electronically Signed By: Jodi Reynolds M.D. On: 10/30/2015 08:18  Narrative EXAM: OVER-READ INTERPRETATION  CT CHEST  The following report is an over-read performed by radiologist Dr. Norleen Reynolds [facility] on 10/24/2015. This over-read does not include interpretation  of cardiac or coronary anatomy or pathology. The coronary calcium  score interpretation by the cardiologist is  attached.  COMPARISON:  None.  FINDINGS: The visualized portions of the lungs are clear. The visualized portions of the mediastinum and chest wall are unremarkable.  IMPRESSION: No significant non-cardiac abnormality seen in visualized portion of the thorax.  Electronically Signed: By: Jodi Kil M.D. On: 10/24/2015 09:30     ______________________________________________________________________________________________       Current Reported Medications:.    Current Meds  Medication Sig   ALPRAZolam (XANAX) 0.25 MG tablet Take 1 tablet by mouth as needed for sleep or anxiety.   amLODipine  (NORVASC ) 5 MG tablet TAKE 1 TABLET BY MOUTH DAILY   levothyroxine (SYNTHROID) 88 MCG tablet 1 tablet on an empty stomach in the morning   lisinopril  (ZESTRIL ) 40 MG tablet Take 1 tablet (40 mg total) by mouth daily.   Melatonin 10 MG TABS Take by mouth as needed.    rosuvastatin  (CRESTOR ) 20 MG tablet Take 1 tablet (20 mg total) by mouth daily.   Physical Exam:    VS:  BP 124/76   Pulse 87   Ht 5' 5 (1.651 m)   Wt 163 lb (73.9 kg)   SpO2 99%   BMI 27.12 kg/m    Wt Readings from Last 3 Encounters:  06/22/24 163 lb (73.9 kg)  03/22/23 149 lb 6.4 oz (67.8 kg)  01/13/22 176 lb (79.8 kg)    GEN: Well nourished, well developed in no acute distress NECK: No JVD; No carotid bruits CARDIAC: RRR, no murmurs, rubs, gallops RESPIRATORY:  Clear to auscultation without rales, wheezing or rhonchi  ABDOMEN: Soft, non-tender, non-distended EXTREMITIES:  No edema; No acute deformity     Asessement and Plan:.    HTN: Blood pressure today 124/76. Continue amlodipine  5 mg daily, lisinopril  40 mg daily.  CAD: Patient with history of coronary artery calcification with calcium  score of 61 in 2016.Stable with no anginal symptoms. No indication for ischemic evaluation.  Heart healthy diet and regular cardiovascular exercise encouraged.  Continue amlodipine  5 mg daily, lisinopril  40 mg daily and  Crestor  20 mg daily.  Start Zetia  10 mg daily.  HLD: Last lipid profile 12/29/2023 indicated total cholesterol 191, triglycerides 82, HDL 71 and LDL 106.  Dr. Okey recommended patient start on Zetia  10 mg daily, she reports she has not yet done this.  Discussed indication for improved cholesterol control and Dr. Nada recommendation for Zetia .  Patient is agreeable to starting Zetia  10 mg daily.  She will have repeat fasting lab work in 6 to 8 weeks.   Disposition: F/u with Dr. Okey in one year or sooner if needed.   Signed, Zaiyden Strozier D See Beharry, NP

## 2024-06-22 ENCOUNTER — Ambulatory Visit: Attending: Cardiology | Admitting: Cardiology

## 2024-06-22 ENCOUNTER — Encounter: Payer: Self-pay | Admitting: Cardiology

## 2024-06-22 VITALS — BP 124/76 | HR 87 | Ht 65.0 in | Wt 163.0 lb

## 2024-06-22 DIAGNOSIS — E7849 Other hyperlipidemia: Secondary | ICD-10-CM | POA: Diagnosis not present

## 2024-06-22 DIAGNOSIS — I10 Essential (primary) hypertension: Secondary | ICD-10-CM | POA: Diagnosis not present

## 2024-06-22 DIAGNOSIS — I2510 Atherosclerotic heart disease of native coronary artery without angina pectoris: Secondary | ICD-10-CM | POA: Diagnosis not present

## 2024-06-22 MED ORDER — EZETIMIBE 10 MG PO TABS
10.0000 mg | ORAL_TABLET | Freq: Every day | ORAL | 3 refills | Status: AC
Start: 2024-06-22 — End: ?

## 2024-06-22 NOTE — Patient Instructions (Signed)
 Medication Instructions:  No changes *If you need a refill on your cardiac medications before your next appointment, please call your pharmacy*  Lab Work: In 6-8 weeks we are going to need fasting labs (from Dr. Okey) If you have labs (blood work) drawn today and your tests are completely normal, you will receive your results only by: MyChart Message (if you have MyChart) OR A paper copy in the mail If you have any lab test that is abnormal or we need to change your treatment, we will call you to review the results.  Testing/Procedures: No testing  Follow-Up: At [facility], you and your health needs are our priority.  As part of our continuing mission to provide you with exceptional heart care, our providers are all part of one team.  This team includes your primary Cardiologist (physician) and Advanced Practice Providers or APPs (Physician Assistants and Nurse Practitioners) who all work together to provide you with the care you need, when you need it.  Your next appointment:   1 year(s)  Provider:   Vina Okey, MD    We recommend signing up for the patient portal called MyChart.  Sign up information is provided on this After Visit Summary.  MyChart is used to connect with patients for Virtual Visits (Telemedicine).  Patients are able to view lab/test results, encounter notes, upcoming appointments, etc.  Non-urgent messages can be sent to your provider as well.   To learn more about what you can do with MyChart, go to [email or web address].

## 2024-07-04 ENCOUNTER — Other Ambulatory Visit: Payer: Self-pay | Admitting: Internal Medicine

## 2024-07-11 ENCOUNTER — Other Ambulatory Visit: Payer: Self-pay | Admitting: Internal Medicine

## 2024-10-13 ENCOUNTER — Other Ambulatory Visit: Payer: Self-pay | Admitting: Internal Medicine
